# Patient Record
Sex: Male | Born: 1965
Health system: Southern US, Community
[De-identification: ages and names within clinical notes are randomized; demographics above are authoritative.]

## PROBLEM LIST (undated history)

## (undated) DIAGNOSIS — K219 Gastro-esophageal reflux disease without esophagitis: Secondary | ICD-10-CM

## (undated) DIAGNOSIS — R310 Gross hematuria: Secondary | ICD-10-CM

## (undated) DIAGNOSIS — I10 Essential (primary) hypertension: Secondary | ICD-10-CM

## (undated) DIAGNOSIS — E785 Hyperlipidemia, unspecified: Secondary | ICD-10-CM

## (undated) DIAGNOSIS — Z87442 Personal history of urinary calculi: Secondary | ICD-10-CM

## (undated) DIAGNOSIS — E119 Type 2 diabetes mellitus without complications: Secondary | ICD-10-CM

## (undated) DIAGNOSIS — R109 Unspecified abdominal pain: Secondary | ICD-10-CM

## (undated) DIAGNOSIS — N200 Calculus of kidney: Secondary | ICD-10-CM

## (undated) DIAGNOSIS — R10A1 Flank pain, right side: Secondary | ICD-10-CM

## (undated) HISTORY — DX: Hyperlipidemia, unspecified: E78.5

## (undated) HISTORY — DX: Essential (primary) hypertension: I10

## (undated) HISTORY — PX: OTHER SURGICAL HISTORY: SHX169

## (undated) HISTORY — DX: Gastro-esophageal reflux disease without esophagitis: K21.9

## (undated) HISTORY — DX: Type 2 diabetes mellitus without complications: E11.9

---

## 2006-09-27 ENCOUNTER — Emergency Department: Payer: Self-pay | Admitting: Emergency Medicine

## 2006-09-30 ENCOUNTER — Ambulatory Visit: Payer: Self-pay | Admitting: Orthopaedic Surgery

## 2007-01-03 ENCOUNTER — Ambulatory Visit: Payer: Self-pay | Admitting: Family Medicine

## 2008-03-02 HISTORY — PX: PERCUTANEOUS NEPHROLITHOTRIPSY: SHX2206

## 2010-04-18 ENCOUNTER — Emergency Department: Payer: Self-pay | Admitting: Emergency Medicine

## 2010-04-28 ENCOUNTER — Ambulatory Visit: Payer: Self-pay | Admitting: Urology

## 2010-05-02 ENCOUNTER — Ambulatory Visit: Payer: Self-pay | Admitting: Urology

## 2010-05-05 ENCOUNTER — Ambulatory Visit: Payer: Self-pay | Admitting: Urology

## 2010-12-03 ENCOUNTER — Ambulatory Visit: Payer: Self-pay | Admitting: Otolaryngology

## 2011-12-22 ENCOUNTER — Ambulatory Visit: Payer: Self-pay | Admitting: Family Medicine

## 2013-08-08 ENCOUNTER — Emergency Department: Payer: Self-pay | Admitting: Emergency Medicine

## 2013-08-08 LAB — CBC
HCT: 45 % (ref 40.0–52.0)
HGB: 14.9 g/dL (ref 13.0–18.0)
MCH: 29.2 pg (ref 26.0–34.0)
MCHC: 33.2 g/dL (ref 32.0–36.0)
MCV: 88 fL (ref 80–100)
PLATELETS: 199 10*3/uL (ref 150–440)
RBC: 5.11 10*6/uL (ref 4.40–5.90)
RDW: 12.8 % (ref 11.5–14.5)
WBC: 8.3 10*3/uL (ref 3.8–10.6)

## 2013-08-08 LAB — URINALYSIS, COMPLETE
BACTERIA: NONE SEEN
BILIRUBIN, UR: NEGATIVE
Glucose,UR: 50 mg/dL (ref 0–75)
KETONE: NEGATIVE
Leukocyte Esterase: NEGATIVE
Nitrite: NEGATIVE
PH: 5 (ref 4.5–8.0)
PROTEIN: NEGATIVE
SPECIFIC GRAVITY: 1.027 (ref 1.003–1.030)
SQUAMOUS EPITHELIAL: NONE SEEN
WBC UR: NONE SEEN /HPF (ref 0–5)

## 2013-08-08 LAB — COMPREHENSIVE METABOLIC PANEL
ALBUMIN: 3.6 g/dL (ref 3.4–5.0)
ALK PHOS: 93 U/L
ANION GAP: 7 (ref 7–16)
BILIRUBIN TOTAL: 0.9 mg/dL (ref 0.2–1.0)
BUN: 16 mg/dL (ref 7–18)
CALCIUM: 8.6 mg/dL (ref 8.5–10.1)
CO2: 22 mmol/L (ref 21–32)
CREATININE: 1.1 mg/dL (ref 0.60–1.30)
Chloride: 110 mmol/L — ABNORMAL HIGH (ref 98–107)
EGFR (Non-African Amer.): >60
GLUCOSE: 111 mg/dL — AB (ref 65–99)
Osmolality: 279 (ref 275–301)
Potassium: 3.5 mmol/L (ref 3.5–5.1)
SGOT(AST): 32 U/L (ref 15–37)
SGPT (ALT): 48 U/L (ref 12–78)
Sodium: 139 mmol/L (ref 136–145)
Total Protein: 6.9 g/dL (ref 6.4–8.2)

## 2013-08-08 LAB — LIPASE, BLOOD: Lipase: 173 U/L (ref 73–393)

## 2013-08-21 ENCOUNTER — Other Ambulatory Visit: Payer: Self-pay | Admitting: Urology

## 2013-09-07 ENCOUNTER — Encounter (HOSPITAL_BASED_OUTPATIENT_CLINIC_OR_DEPARTMENT_OTHER): Payer: Self-pay | Admitting: *Deleted

## 2013-09-11 NOTE — Progress Notes (Signed)
SPOKE W/ PT , HE STATES HAVING SEVERE PAIN AND DOES NOT HAVE ANY PAIN RX.  ADVISED TO CALL OFFICE. IF NO RELIEF GO TO ED.  WILL CALL PT BACK TOMORROW.

## 2013-09-12 ENCOUNTER — Encounter (HOSPITAL_BASED_OUTPATIENT_CLINIC_OR_DEPARTMENT_OTHER): Payer: Self-pay | Admitting: *Deleted

## 2013-09-12 NOTE — Progress Notes (Signed)
NPO AFTER MN. ARRIVE AT 1030. NEEDS HG. MAY TAKE TYLENOL AM DOS W/ SIPS OF WATER. ADVISED PT TO CALL OFFICE TODAY TO GET PAIN RX. HE MAY TAKE THIS AM DOS W/ SIPS  OF WATER.

## 2013-09-13 ENCOUNTER — Ambulatory Visit (HOSPITAL_BASED_OUTPATIENT_CLINIC_OR_DEPARTMENT_OTHER)
Admission: RE | Admit: 2013-09-13 | Discharge: 2013-09-13 | Disposition: A | Discharge status: Home / Self Care | Payer: BC Managed Care – PPO | Source: Ambulatory Visit | Attending: Urology | Admitting: Urology

## 2013-09-13 ENCOUNTER — Encounter (HOSPITAL_BASED_OUTPATIENT_CLINIC_OR_DEPARTMENT_OTHER): Payer: Self-pay | Admitting: Anesthesiology

## 2013-09-13 ENCOUNTER — Ambulatory Visit (HOSPITAL_BASED_OUTPATIENT_CLINIC_OR_DEPARTMENT_OTHER): Payer: BC Managed Care – PPO | Admitting: Anesthesiology

## 2013-09-13 ENCOUNTER — Encounter (HOSPITAL_BASED_OUTPATIENT_CLINIC_OR_DEPARTMENT_OTHER)
Admission: RE | Disposition: A | Discharge status: Home / Self Care | Payer: Self-pay | Source: Ambulatory Visit | Attending: Urology

## 2013-09-13 ENCOUNTER — Encounter (HOSPITAL_BASED_OUTPATIENT_CLINIC_OR_DEPARTMENT_OTHER): Payer: BC Managed Care – PPO | Admitting: Anesthesiology

## 2013-09-13 DIAGNOSIS — Z6836 Body mass index (BMI) 36.0-36.9, adult: Secondary | ICD-10-CM | POA: Insufficient documentation

## 2013-09-13 DIAGNOSIS — R310 Gross hematuria: Secondary | ICD-10-CM | POA: Insufficient documentation

## 2013-09-13 DIAGNOSIS — E669 Obesity, unspecified: Secondary | ICD-10-CM | POA: Insufficient documentation

## 2013-09-13 DIAGNOSIS — N200 Calculus of kidney: Secondary | ICD-10-CM | POA: Insufficient documentation

## 2013-09-13 HISTORY — DX: Personal history of urinary calculi: Z87.442

## 2013-09-13 HISTORY — PX: HOLMIUM LASER APPLICATION: SHX5852

## 2013-09-13 HISTORY — DX: Calculus of kidney: N20.0

## 2013-09-13 HISTORY — DX: Gross hematuria: R31.0

## 2013-09-13 HISTORY — DX: Unspecified abdominal pain: R10.9

## 2013-09-13 HISTORY — PX: CYSTOSCOPY WITH RETROGRADE PYELOGRAM, URETEROSCOPY AND STENT PLACEMENT: SHX5789

## 2013-09-13 HISTORY — DX: Flank pain, right side: R10.A1

## 2013-09-13 LAB — POCT I-STAT, CHEM 8
BUN: 13 mg/dL (ref 6–23)
CALCIUM ION: 1.27 mmol/L — AB (ref 1.12–1.23)
CHLORIDE: 107 meq/L (ref 96–112)
Creatinine, Ser: 0.9 mg/dL (ref 0.50–1.35)
Glucose, Bld: 103 mg/dL — ABNORMAL HIGH (ref 70–99)
HEMATOCRIT: 50 % (ref 39.0–52.0)
Hemoglobin: 17 g/dL (ref 13.0–17.0)
POTASSIUM: 3.8 meq/L (ref 3.7–5.3)
Sodium: 145 mEq/L (ref 137–147)
TCO2: 22 mmol/L (ref 0–100)

## 2013-09-13 SURGERY — CYSTOURETEROSCOPY, WITH RETROGRADE PYELOGRAM AND STENT INSERTION
Anesthesia: General | Site: Ureter | Laterality: Right

## 2013-09-13 MED ORDER — IOHEXOL 350 MG/ML SOLN
INTRAVENOUS | Status: DC | PRN
  Administered 2013-09-13: 24 mL via URETHRAL

## 2013-09-13 MED ORDER — ACETAMINOPHEN 10 MG/ML IV SOLN
INTRAVENOUS | Status: DC | PRN
  Administered 2013-09-13: 1000 mg via INTRAVENOUS

## 2013-09-13 MED ORDER — KETOROLAC TROMETHAMINE 30 MG/ML IJ SOLN
INTRAMUSCULAR | Status: DC | PRN
  Administered 2013-09-13: 30 mg via INTRAVENOUS

## 2013-09-13 MED ORDER — SENNOSIDES-DOCUSATE SODIUM 8.6-50 MG PO TABS: 1.0000 | ORAL_TABLET | Freq: Two times a day (BID) | ORAL | Status: DC

## 2013-09-13 MED ORDER — STERILE WATER FOR IRRIGATION IR SOLN
Status: DC | PRN
  Administered 2013-09-13: 500 mL

## 2013-09-13 MED ORDER — OXYBUTYNIN CHLORIDE 5 MG PO TABS: 5.0000 mg | ORAL_TABLET | Freq: Three times a day (TID) | ORAL | Status: DC | PRN

## 2013-09-13 MED ORDER — LIDOCAINE HCL (CARDIAC) 20 MG/ML IV SOLN
INTRAVENOUS | Status: DC | PRN
  Administered 2013-09-13: 100 mg via INTRAVENOUS

## 2013-09-13 MED ORDER — EPHEDRINE SULFATE 50 MG/ML IJ SOLN
INTRAMUSCULAR | Status: DC | PRN
  Administered 2013-09-13: 10 mg via INTRAVENOUS

## 2013-09-13 MED ORDER — LACTATED RINGERS IV SOLN
INTRAVENOUS | Status: DC
  Administered 2013-09-13: 11:00:00 via INTRAVENOUS
  Filled 2013-09-13: qty 1000

## 2013-09-13 MED ORDER — FENTANYL CITRATE 0.05 MG/ML IJ SOLN
INTRAMUSCULAR | Status: AC
  Filled 2013-09-13: qty 4

## 2013-09-13 MED ORDER — OXYCODONE-ACETAMINOPHEN 5-325 MG PO TABS: 1.0000 | ORAL_TABLET | Freq: Four times a day (QID) | ORAL | Status: DC | PRN

## 2013-09-13 MED ORDER — PROPOFOL INFUSION 10 MG/ML OPTIME
INTRAVENOUS | Status: DC | PRN
  Administered 2013-09-13: 200 mL via INTRAVENOUS

## 2013-09-13 MED ORDER — MIDAZOLAM HCL 5 MG/5ML IJ SOLN
INTRAMUSCULAR | Status: DC | PRN
  Administered 2013-09-13: 2 mg via INTRAVENOUS

## 2013-09-13 MED ORDER — MIDAZOLAM HCL 2 MG/2ML IJ SOLN
INTRAMUSCULAR | Status: AC
  Filled 2013-09-13: qty 2

## 2013-09-13 MED ORDER — PROPOFOL 10 MG/ML IV BOLUS
INTRAVENOUS | Status: DC | PRN
  Administered 2013-09-13: 200 mg via INTRAVENOUS

## 2013-09-13 MED ORDER — SULFAMETHOXAZOLE-TMP DS 800-160 MG PO TABS: 1.0000 | ORAL_TABLET | Freq: Two times a day (BID) | ORAL | Status: DC

## 2013-09-13 MED ORDER — FENTANYL CITRATE 0.05 MG/ML IJ SOLN
INTRAMUSCULAR | Status: DC | PRN
  Administered 2013-09-13 (×3): 50 ug via INTRAVENOUS

## 2013-09-13 MED ORDER — ONDANSETRON HCL 4 MG/2ML IJ SOLN
INTRAMUSCULAR | Status: DC | PRN
  Administered 2013-09-13: 4 mg via INTRAVENOUS

## 2013-09-13 MED ORDER — GENTAMICIN SULFATE 40 MG/ML IJ SOLN
5.0000 mg/kg | Freq: Once | INTRAMUSCULAR | Status: AC
  Administered 2013-09-13: 470 mg via INTRAVENOUS
  Filled 2013-09-13: qty 11.75

## 2013-09-13 MED ORDER — SODIUM CHLORIDE 0.9 % IR SOLN
Status: DC | PRN
  Administered 2013-09-13: 6000 mL via INTRAVESICAL

## 2013-09-13 MED ORDER — DEXAMETHASONE SODIUM PHOSPHATE 10 MG/ML IJ SOLN
INTRAMUSCULAR | Status: DC | PRN
  Administered 2013-09-13: 10 mg via INTRAVENOUS

## 2013-09-13 MED ORDER — GENTAMICIN IN SALINE 1.6-0.9 MG/ML-% IV SOLN
80.0000 mg | INTRAVENOUS | Status: DC
  Filled 2013-09-13: qty 50

## 2013-09-13 SURGICAL SUPPLY — 20 items
BAG DRAIN URO-CYSTO SKYTR STRL (DRAIN) ×3 IMPLANT
BASKET LASER NITINOL 1.9FR (BASKET) ×3 IMPLANT
CANISTER SUCT LVC 12 LTR MEDI- (MISCELLANEOUS) ×3 IMPLANT
CATH INTERMIT  6FR 70CM (CATHETERS) ×3 IMPLANT
CLOTH BEACON ORANGE TIMEOUT ST (SAFETY) ×3 IMPLANT
DRAPE CAMERA CLOSED 9X96 (DRAPES) ×3 IMPLANT
DRSG TEGADERM 2-3/8X2-3/4 SM (GAUZE/BANDAGES/DRESSINGS) ×3 IMPLANT
GLOVE BIO SURGEON STRL SZ 6.5 (GLOVE) ×3 IMPLANT
GLOVE BIO SURGEON STRL SZ7.5 (GLOVE) ×3 IMPLANT
GLOVE INDICATOR 6.5 STRL GRN (GLOVE) ×6 IMPLANT
GOWN STRL REUS W/ TWL XL LVL3 (GOWN DISPOSABLE) ×4 IMPLANT
GOWN STRL REUS W/TWL XL LVL3 (GOWN DISPOSABLE) ×2 IMPLANT
GUIDEWIRE STR DUAL SENSOR (WIRE) ×3 IMPLANT
IV NS IRRIG 3000ML ARTHROMATIC (IV SOLUTION) ×6 IMPLANT
LASER FIBER DISP (UROLOGICAL SUPPLIES) ×3 IMPLANT
PACK CYSTOSCOPY (CUSTOM PROCEDURE TRAY) ×3 IMPLANT
SHEATH ACCESS URETERAL 38CM (SHEATH) ×3 IMPLANT
STENT POLARIS 5FRX26 (STENTS) ×3 IMPLANT
SYRINGE 10CC LL (SYRINGE) ×3 IMPLANT
WATER STERILE IRR 500ML POUR (IV SOLUTION) ×3 IMPLANT

## 2013-09-13 NOTE — Anesthesia Procedure Notes (Signed)
Procedure Name: LMA Insertion Date/Time: 09/13/2013 12:02 PM Performed by: Norva PavlovALLAWAY, Jerome Hands G Pre-anesthesia Checklist: Patient identified, Timeout performed, Emergency Drugs available, Suction available and Patient being monitored Patient Re-evaluated:Patient Re-evaluated prior to inductionPreoxygenation: Pre-oxygenation with 100% oxygen Intubation Type: IV induction Ventilation: Mask ventilation without difficulty LMA: LMA inserted LMA Size: 5.0 Number of attempts: 1 Airway Equipment and Method: Bite block Placement Confirmation: positive ETCO2 Tube secured with: Tape Dental Injury: Teeth and Oropharynx as per pre-operative assessment

## 2013-09-13 NOTE — Anesthesia Preprocedure Evaluation (Addendum)
Anesthesia Evaluation  Patient identified by MRN, date of birth, ID band Patient awake    Reviewed: Allergy & Precautions, H&P , NPO status , Patient's Chart, lab work & pertinent test results  Airway Mallampati: III TM Distance: >3 FB Neck ROM: full    Dental no notable dental hx. (+) Teeth Intact, Dental Advisory Given   Pulmonary neg pulmonary ROS,  Negative sleep study in past breath sounds clear to auscultation  Pulmonary exam normal       Cardiovascular Exercise Tolerance: Good negative cardio ROS  Rhythm:regular Rate:Normal     Neuro/Psych negative neurological ROS  negative psych ROS   GI/Hepatic negative GI ROS, Neg liver ROS,   Endo/Other  negative endocrine ROS  Renal/GU Renal diseaseRenal calculus  negative genitourinary   Musculoskeletal   Abdominal (+) + obese,   Peds  Hematology negative hematology ROS (+)   Anesthesia Other Findings Receeding chin  Reproductive/Obstetrics negative OB ROS                        Anesthesia Physical Anesthesia Plan  ASA: II  Anesthesia Plan: General   Post-op Pain Management:    Induction: Intravenous  Airway Management Planned: LMA  Additional Equipment:   Intra-op Plan:   Post-operative Plan:   Informed Consent: I have reviewed the patients History and Physical, chart, labs and discussed the procedure including the risks, benefits and alternatives for the proposed anesthesia with the patient or authorized representative who has indicated his/her understanding and acceptance.   Dental Advisory Given  Plan Discussed with: CRNA and Surgeon  Anesthesia Plan Comments:        Anesthesia Quick Evaluation

## 2013-09-13 NOTE — Anesthesia Postprocedure Evaluation (Signed)
Anesthesia Post Note  Patient: Jerome Graves  Procedure(s) Performed: Procedure(s) (LRB): CYSTOSCOPY WITH BILATERAL RETROGRADE PYELOGRAM, RIGHT URETEROSCOPY AND STENT PLACEMENT (Bilateral) HOLMIUM LASER APPLICATION (Right)  Anesthesia type: General  Patient location: PACU  Post pain: Pain level controlled  Post assessment: Post-op Vital signs reviewed  Last Vitals: BP 135/90  Pulse 101  Temp(Src) 37 C (Oral)  Resp 14  Ht 6' (1.829 m)  Wt 266 lb 8 oz (120.884 kg)  BMI 36.14 kg/m2  SpO2 95%  Post vital signs: Reviewed  Level of consciousness: sedated  Complications: No apparent anesthesia complications

## 2013-09-13 NOTE — H&P (Signed)
Jerome Graves is an 48 y.o. male.    Chief Complaint: Pre-Op Cysto, Bilateral Retrogrades, Right Ureteroscopic Stone Manipulation.  HPI:   1 - Nephrolithiasis -  Pre 2015 : MET x 1, URS on left x 1 07/2013 - right 87m renal pelvis [facility]. No additional or contralateral stones per report, images unavailable for review.  2 - Metabolic Stone Disease -  Eval 2015: BMP,PTH,Ureate - elevated uric acid; Composition - pending; 24 Hr Urines - pending  3 - Gross Hematuria - Pt wih new gross hematuria and clots 07/2013[facility]. NO cysto yet. Non-smoker. He is a dEngineer, building servicesworking with solvents daily.   PMH sig for HTN  Today RJerryis seen to proceed with cysto and right ureteroscopy to complete his hematuria evaluation and address his right renal stone. No interval fevers. Most recent UCX negative.  Past Medical History  Diagnosis Date  . Renal calculus, right   . Gross hematuria   . History of kidney stones   . Right flank pain     Past Surgical History  Procedure Laterality Date  . Negative sleep study      2003 -- PER PT  . Percutaneous nephrolithotripsy Left 2010  . Pinning right ring finger fx  YRS AGO    History reviewed. No pertinent family history. Social History:  reports that he has never smoked. He has never used smokeless tobacco. He reports that he drinks about one ounce of alcohol per week. He reports that he does not use illicit drugs.  Allergies: No Known Allergies  No prescriptions prior to admission    No results found for this or any previous visit (from the past 48 hour(s)). No results found.  Review of Systems  Constitutional: Negative.  Negative for fever and chills.  HENT: Negative.   Eyes: Negative.   Cardiovascular: Negative.   Genitourinary: Positive for flank pain.  Musculoskeletal: Negative.   Skin: Negative.   Neurological:  Negative.   Endo/Heme/Allergies: Negative.   Psychiatric/Behavioral: Negative.     Height 6' (1.829 m), weight 122.471 kg (270 lb). Physical Exam  Constitutional: He is oriented to person, place, and time. He appears well-developed and well-nourished.  HENT:  Head: Normocephalic and atraumatic.  Eyes: EOM are normal. Pupils are equal, round, and reactive to light.  Neck: Normal range of motion. Neck supple.  Cardiovascular: Normal rate.   Respiratory: Effort normal.  GI: Soft. Bowel sounds are normal.  Genitourinary: Penis normal.  Mild Rt CVAT  Musculoskeletal: Normal range of motion.  Neurological: He is alert and oriented to person, place, and time.  Skin: Skin is warm and dry.  Psychiatric: He has a normal mood and affect. His behavior is normal. Judgment and thought content normal.     Assessment/Plan  1 - Nephrolithiasis - present stone burden w/o hydro on recent CT, but location raises possibility of intermitant obstruiton (ball-valve)  After careful consideration, the patient has elected to undergo URS for goal of stone free and compelte hematuria eval wtih cysto and bilat retrogrades.   We rediscussed ureteroscopic stone manipulation with basketing and laser-lithotripsy in detail.  We rediscussed risks including bleeding, infection, damage to kidney / ureter  bladder, rarely loss of kidney. We rediscussed anesthetic risks and rare but serious surgical complications including DVT, PE, MI, and mortality. We specifically readdressed that in 5-10% a staged approach is required.  Pt voiced understanding and desires to proceed today as planned.  2 - Metabolic Stone Disease - Composition and 24 hr urines to follow.   3 - Gross Hematuria - likely stone disease, but will complete eval with cysto at time of URS today.  Jerome Graves 09/13/2013, 6:16 AM

## 2013-09-13 NOTE — Brief Op Note (Signed)
09/13/2013  12:50 PM  PATIENT:  Jerome Graves  48 y.o. male  PRE-OPERATIVE DIAGNOSIS:  GROSS HEMATURIA, RIGHT RENAL STONE  POST-OPERATIVE DIAGNOSIS:  GROSS HEMATURIA, RIGHT RENAL STONE  PROCEDURE:  Procedure(s): CYSTOSCOPY WITH BILATERAL RETROGRADE PYELOGRAM, RIGHT URETEROSCOPY AND STENT PLACEMENT (Bilateral) HOLMIUM LASER APPLICATION (Right)  SURGEON:  Surgeon(s) and Role:    * Sebastian Acheheodore Keirstin Musil, MD - Primary  PHYSICIAN ASSISTANT:   ASSISTANTS: none   ANESTHESIA:   general  EBL:     BLOOD ADMINISTERED:none  DRAINS: none   LOCAL MEDICATIONS USED:  NONE  SPECIMEN:  Source of Specimen:  Rt renal stone  DISPOSITION OF SPECIMEN:  [facility] for compositional analysis  COUNTS:  YES  TOURNIQUET:  * No tourniquets in log *  DICTATION: .Other Dictation: Dictation Number [id number]  PLAN OF CARE: Discharge to home after PACU  PATIENT DISPOSITION:  PACU - hemodynamically stable.   Delay start of Pharmacological VTE agent (>24hrs) due to surgical blood loss or risk of bleeding: not applicable

## 2013-09-13 NOTE — Discharge Instructions (Signed)
1 - You may have urinary urgency (bladder spasms) and bloody urine on / off with stent in place. This is normal. ° °2 - Call MD or go to ER for fever >102, severe pain / nausea / vomiting not relieved by medications, or acute change in medical status ° °3 - Remove tethered stent on Monday morning at home by pulling on string, then blue/white plastic tubing, and discarding. Dr. Manny is in the office Monday if any issues arise.  °Post Anesthesia Home Care Instructions ° °Activity: °Get plenty of rest for the remainder of the day. A responsible adult should stay with you for 24 hours following the procedure.  °For the next 24 hours, DO NOT: °-Drive a car °-Operate machinery °-Drink alcoholic beverages °-Take any medication unless instructed by your physician °-Make any legal decisions or sign important papers. ° °Meals: °Start with liquid foods such as gelatin or soup. Progress to regular foods as tolerated. Avoid greasy, spicy, heavy foods. If nausea and/or vomiting occur, drink only clear liquids until the nausea and/or vomiting subsides. Call your physician if vomiting continues. ° °Special Instructions/Symptoms: °Your throat may feel dry or sore from the anesthesia or the breathing tube placed in your throat during surgery. If this causes discomfort, gargle with warm salt water. The discomfort should disappear within 24 hours. °[facility] °[phone] °Post Ureteroscopy With or Without Stent Instructions ° °Definitions: ° °Ureter: The duct that transports urine from the kidney to the bladder. °Stent:   A plastic hollow tube that is placed into the ureter, from the kidney to the                 bladder to prevent the ureter from swelling shut. ° °GENERAL INSTRUCTIONS: ° °Despite the fact that no skin incisions were used, the area around the ureter and bladder is raw and irritated. The stent is a foreign body which will further irritate the bladder wall. This irritation is manifested by increased  frequency of urination, both day and night, and by an increase in the urge to urinate. In some, the urge to urinate is present almost always. Sometimes the urge is strong enough that you may not be able to stop yourself from urinating. The only real cure is to remove the stent and then give time for the bladder wall to heal which can't be done until the danger of the ureter swelling shut has passed, which varies. ° °You may see some blood in your urine while the stent is in place and a few days afterwards. Do not be alarmed, even if the urine was clear for a while. Get off your feet and drink lots of fluids until clearing occurs. If you start to pass clots or don't improve, call us. ° °DIET: °You may return to your normal diet immediately. Because of the raw surface of your bladder, alcohol, spicy foods, acid type foods and drinks with caffeine may cause irritation or frequency and should be used in moderation. To keep your urine flowing freely and to avoid constipation, drink plenty of fluids during the day ( 8-10 glasses ). °Tip: Avoid cranberry juice because it is very acidic. ° °ACTIVITY: °Your physical activity doesn't need to be restricted. However, if you are very active, you may see some blood in your urine. We suggest that you reduce your activity under these circumstances until the bleeding has stopped. ° °BOWELS: °It is important to keep your bowels regular during the postoperative period. Straining with bowel movements can   cause bleeding. A bowel movement every other day is reasonable. Use a mild laxative if needed, such as Milk of Magnesia 2-3 tablespoons, or 2 Dulcolax tablets. Call if you continue to have problems. If you have been taking narcotics for pain, before, during or after your surgery, you may be constipated. Take a laxative if necessary. ° ° °MEDICATION: °You should resume your pre-surgery medications unless told not to. In addition you will often be given an antibiotic to prevent  infection. These should be taken as prescribed until the bottles are finished unless you are having an unusual reaction to one of the drugs. ° °PROBLEMS YOU SHOULD REPORT TO US: °· Fevers over 100.5 Fahrenheit. °· Heavy bleeding, or clots ( See above notes about blood in urine ). °· Inability to urinate. °· Drug reactions ( hives, rash, nausea, vomiting, diarrhea ). °· Severe burning or pain with urination that is not improving. ° °FOLLOW-UP: °You will need a follow-up appointment to monitor your progress. Call for this appointment at the number listed above. Usually the first appointment will be about three to fourteen days after your surgery. ° ° ° ° ° °

## 2013-09-13 NOTE — OR Nursing (Signed)
Right ureteral stone taking by Dr. Manny. 

## 2013-09-13 NOTE — Transfer of Care (Signed)
Immediate Anesthesia Transfer of Care Note  Patient: Assunta FoundRodney W Furber  Procedure(s) Performed: Procedure(s): CYSTOSCOPY WITH BILATERAL RETROGRADE PYELOGRAM, RIGHT URETEROSCOPY AND STENT PLACEMENT (Bilateral) HOLMIUM LASER APPLICATION (Right)  Patient Location: PACU  Anesthesia Type:General  Level of Consciousness: awake, alert , oriented and patient cooperative  Airway & Oxygen Therapy: Patient Spontanous Breathing and Patient connected to nasal cannula oxygen  Post-op Assessment: Report given to PACU RN and Post -op Vital signs reviewed and stable  Post vital signs: Reviewed and stable  Complications: No apparent anesthesia complications

## 2013-09-14 ENCOUNTER — Encounter (HOSPITAL_BASED_OUTPATIENT_CLINIC_OR_DEPARTMENT_OTHER): Payer: Self-pay | Admitting: Urology

## 2013-09-15 NOTE — Op Note (Signed)
NAMMarland Kitchen:  Jerome Graves, Jerome Graves               ACCOUNT NO.:  [id number]  MEDICAL RECORD NO.:  [id number]  LOCATION:                                 FACILITY:  PHYSICIAN:  Sebastian Acheheodore Naquan Garman, MD     DATE OF BIRTH:  May 21, 1965  DATE OF PROCEDURE:  09/13/2013 DATE OF DISCHARGE:                              OPERATIVE REPORT   DIAGNOSES:  Right renal stone, refractory right flank pain, history of gross hematuria.  PROCEDURE: 1. Cystoscopy with bilateral retrograde pyelogram interpretation. 2. Right ureteroscopy with laser lithotripsy. 3. Insertion of right ureteral stent, 5 x 26 Polaris with tether to     the dorsum of the penis.  FINDINGS: 1. Unremarkable urinary bladder. 2. Unremarkable left retrograde pyelogram. 3. Right retrograde pyelogram with mild caliectasis and questionable     filling defect in the proximal ureter. 4. Single large fusiform stone free floating in the renal pelvis on     the right, this is completely removed. 5. Excellent placement of right ureteral stent, proximal curl renal     pelvis distal aspect urinary bladder, following stent placement.  ESTIMATED BLOOD LOSS:  Nil.  INDICATION:  [name] is a very pleasant 48 year old gentleman with history of recurrent nephrolithiasis.  He was found on workup with gross hematuria and then colicky flank pain to have a right renal stone.  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  Given these constellation of findings, options were discussed including office cystoscopy and observation of stone versus definitive management of stone with various techniques and he wished to proceed with operative cysto and right ureteroscopic stone manipulation as an efficient means to complete his hematuria evaluation to treat his right stone that is likely ball valving and symptomatic.  Informed consent was obtained and placed in medical record.  DESCRIPTION OF PROCEDURE:  The patient being Jerome Graves, procedure being right ureteroscopic stone manipulation, cystoscopy, and bilateral pyelogram was confirmed.  Procedure was carried out.  Time-out was performed.  Intravenous antibiotics administered.  General LMA anesthesia was introduced.  The patient placed into a low lithotomy position.  Sterile field was created by prepping and draping the patient's penis, perineum, and proximal thighs using iodine x3.  Next, cystourethroscopy was performed using a 22-French rigid cystoscope with 12-degree offset lens.  Inspection of the anterior and posterior urethra unremarkable.  Inspection of urinary bladder revealed no diverticula, calcifications, or papillary lesions.  The left ureteral orifice was cannulated with a 6-French end-hole catheter and left retrograde pyelogram was obtained.  Left retrograde pyelogram demonstrated a single left ureter with single system left kidney.  No filling defects or narrowing noted.  Similarly, right retrograde pyelogram was seen.  Right retrograde pyelogram demonstrated single right ureter with single system right kidney.  There was a questionable filling defect in the proximal ureter with mild hydroureteronephrosis above this consistent with likely distal migration of known ball valving right renal stone.  A 0.038 Glidewire was advanced at the level of the upper pole and set aside as a safety wire.  Next, semi-rigid ureteroscopy was performed in the distal two-thirds of the right ureter alongside a separate Sensor working wire  with an 8-French feeding tube in the urinary bladder for pressure release.  No mucosal abnormalities or calcifications were seen in the distal ureter.  Next, the semi-rigid ureteroscope was exchanged for a 12/14 38-cm ureteral access sheath, which was placed under continuous fluoroscopic guidance to the level of proximal ureter.  Next, flexible digital ureteroscopy was performed using a 6-French tip flexible digital  ureteroscope.  Inspection of the proximal ureter and systematic inspection of each calix of the kidney revealed a rather large fusiform floating stone in the renal pelvis likely consistent with suspected ball valving stone on the right side.  No additional calcifications were seen.  The stone appeared to be much too large for simple basketing.  As such, holmium laser energy was applied to the stone using settings of 0.5 joules and 5 Hz using a 200 nanometer fiber and the stone was fragmented into fragments approximately 2-3 mm or less in diameter.  These were then sequentially grasped with an escape basket and brought out in their entirety and set aside for compositional analysis.  Repeat systematic inspection of the kidney x2 revealed complete resolution of all stone fragments  Larger than one-third millimeter, no evidence of perforation.  There was excellent hemostasis.  The sheath was removed under continuous ureteroscopic vision and no mucosal abnormalities were found.  Finally, a new 5 x 26 Polaris type stent was placed with remaining safety wire using fluoroscopic guidance and tether fashioned to the dorsum of the penis.  Good proximal and distal deployment were noted.  Bladder was emptied per cystoscope.  Procedure was then terminated.  The patient tolerated the procedure well.  There were no immediate periprocedural complications. The patient was taken to the postanesthesia care unit in stable condition.          ______________________________ Sebastian Ache, MD     TM/MEDQ  D:  09/13/2013  T:  09/13/2013  Job:  [id number]

## 2014-08-06 ENCOUNTER — Other Ambulatory Visit: Payer: Self-pay | Admitting: Family Medicine

## 2014-08-06 DIAGNOSIS — I10 Essential (primary) hypertension: Secondary | ICD-10-CM

## 2014-08-06 MED ORDER — AMLODIPINE BESYLATE 5 MG PO TABS
5.0000 mg | ORAL_TABLET | Freq: Every day | ORAL | Status: DC
Start: 2014-08-06 — End: 2014-09-21

## 2014-09-20 ENCOUNTER — Ambulatory Visit: Payer: Self-pay | Admitting: Family Medicine

## 2014-09-21 ENCOUNTER — Ambulatory Visit (INDEPENDENT_AMBULATORY_CARE_PROVIDER_SITE_OTHER): Payer: BLUE CROSS/BLUE SHIELD | Admitting: Family Medicine

## 2014-09-21 ENCOUNTER — Encounter: Payer: Self-pay | Admitting: Family Medicine

## 2014-09-21 VITALS — BP 150/118 | HR 90 | Temp 99.0°F | Resp 16 | Ht 72.0 in | Wt 269.8 lb

## 2014-09-21 DIAGNOSIS — Z87442 Personal history of urinary calculi: Secondary | ICD-10-CM | POA: Insufficient documentation

## 2014-09-21 DIAGNOSIS — Z8619 Personal history of other infectious and parasitic diseases: Secondary | ICD-10-CM

## 2014-09-21 DIAGNOSIS — R739 Hyperglycemia, unspecified: Secondary | ICD-10-CM | POA: Insufficient documentation

## 2014-09-21 DIAGNOSIS — K219 Gastro-esophageal reflux disease without esophagitis: Secondary | ICD-10-CM | POA: Insufficient documentation

## 2014-09-21 DIAGNOSIS — I10 Essential (primary) hypertension: Secondary | ICD-10-CM | POA: Diagnosis not present

## 2014-09-21 DIAGNOSIS — Z658 Other specified problems related to psychosocial circumstances: Secondary | ICD-10-CM

## 2014-09-21 DIAGNOSIS — F439 Reaction to severe stress, unspecified: Secondary | ICD-10-CM

## 2014-09-21 DIAGNOSIS — K1120 Sialoadenitis, unspecified: Secondary | ICD-10-CM | POA: Insufficient documentation

## 2014-09-21 HISTORY — DX: Personal history of other infectious and parasitic diseases: Z86.19

## 2014-09-21 HISTORY — DX: Personal history of urinary calculi: Z87.442

## 2014-09-21 MED ORDER — SERTRALINE HCL 50 MG PO TABS: 50.0000 mg | ORAL_TABLET | Freq: Every day | ORAL | Status: DC

## 2014-09-21 MED ORDER — AMLODIPINE BESYLATE 5 MG PO TABS: 5.0000 mg | ORAL_TABLET | Freq: Every day | ORAL | Status: DC

## 2014-09-21 NOTE — Progress Notes (Signed)
Subjective:     Patient ID: Jerome Graves, male   DOB: [birth year], 49 y.o.   MRN: [id number]  HPI  Chief Complaint  Patient presents with  . Hypertension    patient is here for follow up, last office visit was 07/09/14. At last office visit patient was advised to continue Lisinopril and to start Amlodipine Besylate , blood pressure in office that day was 172/96  He was lost to f/u and is not on his medication at this time. Also reports increased stress at work with increased irritability. 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   Review of Systems     Objective:   Physical Exam  Constitutional: He appears well-developed and well-nourished. No distress.  Psychiatric: He has a normal mood and affect.       Assessment:    1. Situational stres - sertraline (ZOLOFT) 50 MG tablet; Take 1 tablet (50 mg total) by mouth daily. Start at 1/2 pill for the first 5 days.  Dispense: 30 tablet; Refill: 0  2. Essential hypertension- - amLODipine (NORVASC) 5 MG tablet; Take 1 tablet (5 mg total) by mouth daily.  Dispense: 90 tablet; Refill: 0    Plan:    Resume bp meds (has refills on lisinopril). Will reassess in three weeks.

## 2014-09-21 NOTE — Patient Instructions (Addendum)
Resume blood pressure medication. Try sertraline at 1/2 pill for the first few days then a whole pill.

## 2014-10-11 ENCOUNTER — Ambulatory Visit (INDEPENDENT_AMBULATORY_CARE_PROVIDER_SITE_OTHER): Payer: BLUE CROSS/BLUE SHIELD | Admitting: Family Medicine

## 2014-10-11 ENCOUNTER — Encounter: Payer: Self-pay | Admitting: Family Medicine

## 2014-10-11 VITALS — BP 140/84 | HR 106 | Temp 98.7°F | Resp 18 | Wt 265.4 lb

## 2014-10-11 DIAGNOSIS — I10 Essential (primary) hypertension: Secondary | ICD-10-CM

## 2014-10-11 DIAGNOSIS — Z658 Other specified problems related to psychosocial circumstances: Secondary | ICD-10-CM

## 2014-10-11 DIAGNOSIS — F439 Reaction to severe stress, unspecified: Secondary | ICD-10-CM

## 2014-10-11 MED ORDER — AMLODIPINE BESYLATE 10 MG PO TABS: 10.0000 mg | ORAL_TABLET | Freq: Every day | ORAL | Status: DC

## 2014-10-11 MED ORDER — SERTRALINE HCL 50 MG PO TABS: 50.0000 mg | ORAL_TABLET | Freq: Every day | ORAL | Status: DC

## 2014-10-11 NOTE — Patient Instructions (Signed)
If blood pressure is ok at your DOT physical continue on current dose and f/u in 6 months. Let me know if you need a higher dose of sertraline.

## 2014-10-11 NOTE — Progress Notes (Signed)
Subjective:     Patient ID: Jerome Graves, male   DOB: 1965-07-11, 49 y.o.   MRN: [id number]  HPI  Chief Complaint  Patient presents with  . Follow-up    hypertension  . Follow-up    situational stress  States he is feeling better. Has coped better with stress at work and he reports his wife has noted changes as well. His DOT physical is coming up in 2 weeks. Wishes to optimize his blood pressure.   Review of Systems  Respiratory: Negative for shortness of breath.   Cardiovascular: Negative for chest pain and palpitations.       Objective:   Physical Exam  Constitutional: He appears well-developed and well-nourished. No distress.  Cardiovascular: Normal rate and regular rhythm.   Pulmonary/Chest: Breath sounds normal.  Musculoskeletal: He exhibits no edema (of lower extremities).       Assessment:    1. Situational stress - sertraline (ZOLOFT) 50 MG tablet; Take 1 tablet (50 mg total) by mouth daily.  Dispense: 30 tablet; Refill: 5  2. Essential hypertension - amLODipine (NORVASC) 10 MG tablet; Take 1 tablet (10 mg total) by mouth daily.  Dispense: 30 tablet; Refill    Plan:   F/u in 6 months or sooner as needed.

## 2014-11-16 ENCOUNTER — Telehealth: Payer: Self-pay | Admitting: Family Medicine

## 2014-11-16 NOTE — Telephone Encounter (Signed)
Pt contacted office for refill request on the following medications:   °

## 2015-04-18 ENCOUNTER — Ambulatory Visit: Payer: BLUE CROSS/BLUE SHIELD | Admitting: Family Medicine

## 2015-07-15 ENCOUNTER — Encounter: Payer: Self-pay | Admitting: Family Medicine

## 2015-07-15 ENCOUNTER — Ambulatory Visit (INDEPENDENT_AMBULATORY_CARE_PROVIDER_SITE_OTHER): Payer: BLUE CROSS/BLUE SHIELD | Admitting: Family Medicine

## 2015-07-15 VITALS — BP 162/100 | HR 109 | Temp 98.3°F | Resp 19 | Wt 285.4 lb

## 2015-07-15 DIAGNOSIS — W57XXXA Bitten or stung by nonvenomous insect and other nonvenomous arthropods, initial encounter: Secondary | ICD-10-CM | POA: Diagnosis not present

## 2015-07-15 DIAGNOSIS — I152 Hypertension secondary to endocrine disorders: Secondary | ICD-10-CM | POA: Insufficient documentation

## 2015-07-15 DIAGNOSIS — T148 Other injury of unspecified body region: Secondary | ICD-10-CM

## 2015-07-15 DIAGNOSIS — I10 Essential (primary) hypertension: Secondary | ICD-10-CM

## 2015-07-15 MED ORDER — PREDNISONE 10 MG PO TABS: ORAL_TABLET | ORAL | Status: DC

## 2015-07-15 NOTE — Progress Notes (Signed)
Subjective:     Patient ID: Jerome Graves, male   DOB: 1965/07/14, 50 y.o.   MRN: [id number]  HPI  Chief Complaint  Patient presents with  . Rash    Patient comes in office today with concerns of rash that has spreaded over his entire body. Patient states that on Tuesday 07/09/15, he was out in woods and had pulled a tick off on the right side of his back. Patient states that 48 hrs after pulling off tick he noticed a rash on the left side of his abdomen which has now spread through out his body. Patient states that he has been using otc Calomine lotion with no relief.   . Hypertension    Follow up form 10/11/14, patient was advised to continue Amlodipine 10mg , paitents blood pressure in house was 140/84. Patient reports poor compliance on medication.   States rash started in his left lateral chest area but now has it on his arms and lower abdomen and beginning on his left upper leg. States it is intensely itchy. Will get transient relief with a hot shower. No fever or flu like symptoms. States area around rash bite has improved. 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   Review of Systems     Objective:   Physical Exam  Constitutional: He appears well-developed and well-nourished. No distress.  Skin:  Numerous excoriated papules on his arms. Raised plaques on his abdomen. Back is spared. Left upper back with papule with minimal inflammatory flare c/w prior tick bite.       Assessment:    1. Insect bites - predniSONE (DELTASONE) 10 MG tablet; Taper daily as follows: 6 pills, 5, 4, 3, 2, 1  Dispense: 21 tablet; Refill: 0  2. Essential hypertension: poor control     Plan:    Discussed use of oral Benadryl at night and plain Calamine topically. Resume bp medications for 2-4 weeks and f/u in the office.

## 2015-07-15 NOTE — Patient Instructions (Addendum)
Try Benadryl at night to help you sleep. Hot shower at night will help reduce itching. May use plain calamine for your skin. Resume taking your bp medications regularly and f/u with me in 2-4 weeks.

## 2015-10-28 ENCOUNTER — Ambulatory Visit (INDEPENDENT_AMBULATORY_CARE_PROVIDER_SITE_OTHER): Payer: BLUE CROSS/BLUE SHIELD | Admitting: Family Medicine

## 2015-10-28 ENCOUNTER — Encounter: Payer: Self-pay | Admitting: Family Medicine

## 2015-10-28 VITALS — BP 154/108 | HR 92 | Temp 98.1°F | Resp 16 | Wt 277.2 lb

## 2015-10-28 DIAGNOSIS — I10 Essential (primary) hypertension: Secondary | ICD-10-CM

## 2015-10-28 MED ORDER — CHLORTHALIDONE 25 MG PO TABS: 25.0000 mg | ORAL_TABLET | Freq: Every day | ORAL | 1 refills | Status: DC

## 2015-10-28 NOTE — Progress Notes (Signed)
Subjective:     Patient ID: Jerome Graves, male   DOB: 08/09/1965, 50 y.o.   MRN: [id number]  HPI  Chief Complaint  Patient presents with  . Hypertension    Patient comes in office today for follow up visit, last visit was 07/15/15. At last visit patient had poor control over blood pressure, blood pressure reading in office was 162/100. Patient was advised last visit to resume medication and return in 2-4 weeks. Patient reports poor compliance today with medication stating that he ran out of medication over 30 days ago and that medication did not help with symptom control. Today patient would like to doiscuss medication options.   States he recently had his DOT physical and did not pass due to his elevated bp.   Review of Systems  Respiratory: Negative for shortness of breath.   Cardiovascular: Negative for chest pain and palpitations.       Objective:   Physical Exam  Constitutional: He appears well-developed and well-nourished. No distress.  Cardiovascular: Normal rate and regular rhythm.   Pulmonary/Chest: Breath sounds normal.  Musculoskeletal: He exhibits no edema (of lower extremities).       Assessment:    1. Essential hypertensio - chlorthalidone (HYGROTON) 25 MG tablet; Take 1 tablet (25 mg total) by mouth daily.  Dispense: 30 tablet; Refill: 1    Plan:    Further f/u in 3 weeks; may call if not tolerating the medication

## 2015-10-28 NOTE — Patient Instructions (Signed)
Let me know if don't tolerate the medication.

## 2015-11-18 ENCOUNTER — Ambulatory Visit (INDEPENDENT_AMBULATORY_CARE_PROVIDER_SITE_OTHER): Payer: BLUE CROSS/BLUE SHIELD | Admitting: Family Medicine

## 2015-11-18 ENCOUNTER — Encounter: Payer: Self-pay | Admitting: Family Medicine

## 2015-11-18 VITALS — BP 146/100 | HR 60 | Temp 98.7°F | Resp 16 | Wt 270.6 lb

## 2015-11-18 DIAGNOSIS — I10 Essential (primary) hypertension: Secondary | ICD-10-CM

## 2015-11-18 MED ORDER — LOSARTAN POTASSIUM 50 MG PO TABS: 50.0000 mg | ORAL_TABLET | Freq: Every day | ORAL | 1 refills | Status: DC

## 2015-11-18 NOTE — Patient Instructions (Signed)
Avoid decongestants but can use Delsym, and Claritin or similar for post nasal drainage.

## 2015-11-18 NOTE — Progress Notes (Signed)
Subjective:     Patient ID: Jerome Graves, male   DOB: 10/08/1965, 50 y.o.   MRN: [id number]  HPI  Chief Complaint  Patient presents with  . Hypertension    Patient is present in office today for three week follow, last office visit 10/28/15 patient was started on Chlorthalidone 25mg . Patient blood pressure at last visit was 154/108, patient reports good compliance and tolerance on medication  States he has cold sx but has avoided otc medication. Reports his DOT physical is in 3 weeks.   Review of Systems     Objective:   Physical Exam  Constitutional: He appears well-developed and well-nourished. No distress.  Cardiovascular: Normal rate and regular rhythm.   Pulmonary/Chest: Breath sounds normal.  Musculoskeletal: He exhibits no edema (of lower extremities).       Assessment:    1. Essential hypertension: Continue chlorthalidone - losartan (COZAAR) 50 MG tablet; Take 1 tablet (50 mg total) by mouth daily.  Dispense: 30 tablet; Refill: 1    Plan:    Discussed otc cold medications that would not affect bp.

## 2015-12-02 ENCOUNTER — Ambulatory Visit (INDEPENDENT_AMBULATORY_CARE_PROVIDER_SITE_OTHER): Payer: BLUE CROSS/BLUE SHIELD | Admitting: Family Medicine

## 2015-12-02 ENCOUNTER — Encounter: Payer: Self-pay | Admitting: Family Medicine

## 2015-12-02 VITALS — BP 146/96 | HR 100 | Temp 98.1°F | Resp 16 | Wt 273.8 lb

## 2015-12-02 DIAGNOSIS — I10 Essential (primary) hypertension: Secondary | ICD-10-CM | POA: Diagnosis not present

## 2015-12-02 MED ORDER — LOSARTAN POTASSIUM 100 MG PO TABS: 100.0000 mg | ORAL_TABLET | Freq: Every day | ORAL | 0 refills | Status: DC

## 2015-12-02 NOTE — Progress Notes (Signed)
Subjective:     Patient ID: Jerome Graves, male   DOB: 16-Jan-1966, 50 y.o.   MRN: [id number]  HPI  Chief Complaint  Patient presents with  . Hypertension    Patient is present in office today for two week follow up, last office visit was 11/18/15. Patient blood pressure at last visit was 146/100, he was started on Losartan 50mg  and advised to continue Chlorthalidone. Patient reports that he is taking medication as prescribed and is tolerating well.   States he will have to update his DOT exam within 3 weeks.   Review of Systems     Objective:   Physical Exam  Constitutional: He appears well-developed and well-nourished. No distress.  Cardiovascular: Normal rate and regular rhythm.   Pulmonary/Chest: Breath sounds normal.  Musculoskeletal: He exhibits no edema (of lower extremities).       Assessment:    1. Essential hypertension: continue chlorthalidone - losartan (COZAAR) 100 MG tablet; Take 1 tablet (100 mg total) by mouth daily.  Dispense: 30 tablet; Refill: 0    Plan:    F/u in two weeks.

## 2015-12-02 NOTE — Patient Instructions (Signed)
You may use up the last of the 50 mg.pills by taking two daily.

## 2015-12-16 ENCOUNTER — Ambulatory Visit (INDEPENDENT_AMBULATORY_CARE_PROVIDER_SITE_OTHER): Payer: BLUE CROSS/BLUE SHIELD | Admitting: Family Medicine

## 2015-12-16 ENCOUNTER — Encounter: Payer: Self-pay | Admitting: Family Medicine

## 2015-12-16 VITALS — BP 118/92 | HR 68 | Temp 98.4°F | Resp 16 | Wt 269.4 lb

## 2015-12-16 DIAGNOSIS — I10 Essential (primary) hypertension: Secondary | ICD-10-CM

## 2015-12-16 MED ORDER — LOSARTAN POTASSIUM 100 MG PO TABS: 100.0000 mg | ORAL_TABLET | Freq: Every day | ORAL | 1 refills | Status: DC

## 2015-12-16 MED ORDER — CHLORTHALIDONE 25 MG PO TABS: 25.0000 mg | ORAL_TABLET | Freq: Every day | ORAL | 1 refills | Status: DC

## 2015-12-16 NOTE — Patient Instructions (Signed)
Return sooner if bp starts to get out of control. We will update your labs and recheck your bp in 6 months.

## 2015-12-16 NOTE — Progress Notes (Signed)
Subjective:     Patient ID: Jerome Graves, male   DOB: 1965/03/09, 50 y.o.   MRN: [id number]  HPI  Chief Complaint  Patient presents with  . Hypertension    Patient is present in office today for two week follow up, last office visit was 12/02/15. At last visit blood pressure in house was 146/96 patient was advised to continue Chlorthalidone and Losartan 100mg . Patient reports good compliance and tolerance of medication.   He is due to DOT physical in the next week. Reports getting home bp's in the 120/80 range.   Review of Systems     Objective:   Physical Exam  Constitutional: He appears well-developed and well-nourished. No distress.  Cardiovascular: Normal rate and regular rhythm.   Pulmonary/Chest: Breath sounds normal.  Musculoskeletal: He exhibits no edema (of lower extremities).       Assessment:    1. Essential hypertension - losartan (COZAAR) 100 MG tablet; Take 1 tablet (100 mg total) by mouth daily.  Dispense: 90 tablet; Refill: 1 - chlorthalidone (HYGROTON) 25 MG tablet; Take 1 tablet (25 mg total) by mouth daily.  Dispense: 90 tablet; Refill: 1    Plan:    Will update labs at followup in 6 months..Marland Kitchen

## 2017-01-18 ENCOUNTER — Other Ambulatory Visit: Payer: Self-pay | Admitting: Family Medicine

## 2017-01-18 DIAGNOSIS — I10 Essential (primary) hypertension: Secondary | ICD-10-CM

## 2017-06-28 ENCOUNTER — Ambulatory Visit (INDEPENDENT_AMBULATORY_CARE_PROVIDER_SITE_OTHER): Payer: BLUE CROSS/BLUE SHIELD | Admitting: Family Medicine

## 2017-06-28 ENCOUNTER — Encounter: Payer: Self-pay | Admitting: Family Medicine

## 2017-06-28 VITALS — BP 182/112 | HR 125 | Temp 98.4°F | Resp 16 | Ht 72.0 in | Wt 260.0 lb

## 2017-06-28 DIAGNOSIS — J0110 Acute frontal sinusitis, unspecified: Secondary | ICD-10-CM | POA: Diagnosis not present

## 2017-06-28 DIAGNOSIS — I10 Essential (primary) hypertension: Secondary | ICD-10-CM | POA: Diagnosis not present

## 2017-06-28 MED ORDER — CHLORTHALIDONE 25 MG PO TABS: 25.0000 mg | ORAL_TABLET | Freq: Every day | ORAL | 0 refills | Status: DC

## 2017-06-28 MED ORDER — AMOXICILLIN-POT CLAVULANATE 875-125 MG PO TABS: 1.0000 | ORAL_TABLET | Freq: Two times a day (BID) | ORAL | 0 refills | Status: DC

## 2017-06-28 MED ORDER — HYDROCODONE-HOMATROPINE 5-1.5 MG/5ML PO SYRP: 5.0000 mL | ORAL_SOLUTION | Freq: Four times a day (QID) | ORAL | 0 refills | Status: AC | PRN

## 2017-06-28 MED ORDER — HYDROCODONE-HOMATROPINE 5-1.5 MG/5ML PO SYRP: 5.0000 mL | ORAL_SOLUTION | Freq: Four times a day (QID) | ORAL | 0 refills | Status: DC | PRN

## 2017-06-28 MED ORDER — LOSARTAN POTASSIUM 50 MG PO TABS
50.0000 mg | ORAL_TABLET | Freq: Every day | ORAL | 0 refills | Status: DC
Start: 2017-06-28 — End: 2018-02-24

## 2017-06-28 NOTE — Progress Notes (Signed)
  Subjective:     Patient ID: Jerome Graves, male   DOB: 04-Apr-1965, 52 y.o.   MRN: [id number] Chief Complaint  Patient presents with  . URI    onset 1 month, syptoms include: sinus pressure, blowing out green/yellow snot, coughing, headacheschest/nasal congestion, SOB and wheezing.  . Medication Refill    has been out of bp med for 3 months  . Hypertension   HPI Patient reports increased frontal sinus pressure, purulent sinus drainage, post nasal drainage and accompanying cough. Has taken Mucinex D this AM. Review of Systems     Objective:   Physical Exam  Constitutional: He appears well-developed and well-nourished. No distress.  Cardiovascular: Regular rhythm.  tachycardic  Ears: T.M's intact without inflammation Sinuses: mild frontal sinus tendernes Throat: no tonsillar enlargement or exudate Neck: no cervical adenopathy Lungs: clear     Assessment:    1. Acute frontal sinusitis, recurrence not specified; rx for hydrocodone cough syrup - amoxicillin-clavulanate (AUGMENTIN) 875-125 MG tablet; Take 1 tablet by mouth 2 (two) times daily.  Dispense: 20 tablet; Refill: 0  2. Essential hypertension - chlorthalidone (HYGROTON) 25 MG tablet; Take 1 tablet (25 mg total) by mouth daily.  Dispense: 30 tablet; Refill: 0 - losartan (COZAAR) 50 MG tablet; Take 1 tablet (50 mg total) by mouth daily.  Dispense: 30 tablet; Refill: 0    Plan:    Avoid decongestants. Discussed use of saline spray, Mucinex, Delsym, and Claritin for nasal drip. F/u bp in 3 weeks.

## 2017-06-28 NOTE — Patient Instructions (Signed)
Discussed use of saline nasal spray and Mucinex. For nasal drip try Claritin and Delsym for cough

## 2017-07-19 ENCOUNTER — Encounter: Payer: Self-pay | Admitting: Family Medicine

## 2017-07-19 ENCOUNTER — Ambulatory Visit (INDEPENDENT_AMBULATORY_CARE_PROVIDER_SITE_OTHER): Payer: BLUE CROSS/BLUE SHIELD | Admitting: Family Medicine

## 2017-07-19 VITALS — BP 126/100 | HR 114 | Temp 98.6°F | Ht 72.0 in | Wt 248.1 lb

## 2017-07-19 DIAGNOSIS — I10 Essential (primary) hypertension: Secondary | ICD-10-CM

## 2017-07-19 NOTE — Patient Instructions (Signed)
Continue Losartan only. Continue your weight loss efforts. Come in for a nurse bp check and weight check in 3-4 weeks.

## 2017-07-19 NOTE — Progress Notes (Signed)
  Subjective:     Patient ID: Jerome Graves, male   DOB: 12/25/1965, 52 y.o.   MRN: [id number] Chief Complaint  Patient presents with  . Hypertension   HPI States he stopped both bp medications for a few days due to feeling bad and low bp (SBP 110).  States he lost taste during course of abx for sinusitis. He has lost 12 lbs since April eating more fruits and vegetables: "I am tired of being fat.) Just resumed both bp medications in the last two days.  Review of Systems     Objective:   Physical Exam  Constitutional: He appears well-developed and well-nourished. No distress.       Assessment:    1. Essential hypertension: improving    Plan:    Continue on Losartan alone and dietary food choices. Nurse bp and weight check in 3-4 weeks. Consider updating labs at next visit.

## 2017-10-05 DIAGNOSIS — L03116 Cellulitis of left lower limb: Secondary | ICD-10-CM | POA: Diagnosis not present

## 2017-10-05 DIAGNOSIS — Z8739 Personal history of other diseases of the musculoskeletal system and connective tissue: Secondary | ICD-10-CM | POA: Diagnosis not present

## 2017-10-05 DIAGNOSIS — M25562 Pain in left knee: Secondary | ICD-10-CM | POA: Diagnosis not present

## 2017-10-06 DIAGNOSIS — L03116 Cellulitis of left lower limb: Secondary | ICD-10-CM | POA: Diagnosis not present

## 2017-10-15 DIAGNOSIS — T792XXA Traumatic secondary and recurrent hemorrhage and seroma, initial encounter: Secondary | ICD-10-CM | POA: Diagnosis not present

## 2017-10-20 DIAGNOSIS — M7052 Other bursitis of knee, left knee: Secondary | ICD-10-CM | POA: Diagnosis not present

## 2017-10-20 DIAGNOSIS — M25562 Pain in left knee: Secondary | ICD-10-CM | POA: Diagnosis not present

## 2018-02-24 ENCOUNTER — Encounter: Payer: Self-pay | Admitting: Family Medicine

## 2018-02-24 ENCOUNTER — Other Ambulatory Visit: Payer: Self-pay

## 2018-02-24 ENCOUNTER — Ambulatory Visit (INDEPENDENT_AMBULATORY_CARE_PROVIDER_SITE_OTHER): Payer: BLUE CROSS/BLUE SHIELD | Admitting: Family Medicine

## 2018-02-24 VITALS — BP 180/108 | HR 121 | Temp 98.8°F | Ht 72.0 in | Wt 249.4 lb

## 2018-02-24 DIAGNOSIS — R6889 Other general symptoms and signs: Secondary | ICD-10-CM | POA: Diagnosis not present

## 2018-02-24 DIAGNOSIS — I10 Essential (primary) hypertension: Secondary | ICD-10-CM

## 2018-02-24 MED ORDER — LOSARTAN POTASSIUM 50 MG PO TABS: 50.0000 mg | ORAL_TABLET | Freq: Every day | ORAL | 0 refills | Status: DC

## 2018-02-24 MED ORDER — HYDROCODONE-HOMATROPINE 5-1.5 MG/5ML PO SYRP: 5.0000 mL | ORAL_SOLUTION | Freq: Four times a day (QID) | ORAL | 0 refills | Status: AC | PRN

## 2018-02-24 MED ORDER — HYDROCODONE-HOMATROPINE 5-1.5 MG/5ML PO SYRP: 5.0000 mL | ORAL_SOLUTION | Freq: Four times a day (QID) | ORAL | 0 refills | Status: DC | PRN

## 2018-02-24 NOTE — Patient Instructions (Signed)
Discussed use of Mucinex. Let me know if not improving over the next few days.

## 2018-02-24 NOTE — Progress Notes (Signed)
  Subjective:     Patient ID: Jerome Graves, male   DOB: 08/25/1965, 52 y.o.   MRN: [id number] Chief Complaint  Patient presents with  . flu like symptoms    since sunday 02/20/18   HPI Reports his niece was diagnosed with the flu and his wife who accompanies is sick as well. No flu shot this season. Non-compliant with bp follow up and has run out of medication months ago.  Review of Systems     Objective:   Physical Exam Constitutional:      General: He is not in acute distress.    Appearance: He is ill-appearing.   Ears: T.M's intact without inflammation Throat: no tonsillar enlargement or exudate Neck: no cervical adenopathy Lungs: Deep breathing provokes cough and expiratory wheezes.     Assessment:    1. Flu-like symptoms: wife's flu swab negative Hydrocodone cough syrup 2. Essential hypertension - losartan (COZAAR) 50 MG tablet; Take 1 tablet (50 mg total) by mouth daily.  Dispense: 90 tablet; Refill: 0    Plan:    Discussed use of Mucinex. Work excuse for 02/25/18. F/u hypertension in 4 weeks.

## 2018-03-22 ENCOUNTER — Telehealth: Payer: Self-pay

## 2018-03-22 NOTE — Telephone Encounter (Signed)
I was told patient wanted his blood pressure check. I Checked blood pressure on Left arm, sitting,large cuff 180/110. Provider was advised of reading and per our record patient is scheduled to come back on 03/24/2018 when I told patient this he reported that he thought his appointment was today at 8:00 am because that is what the his paper said and he received the phone call per provider his next appointment is at 10 am and patient stated he couldn't wait that long got up and left out of the room.

## 2018-03-24 ENCOUNTER — Ambulatory Visit: Payer: Self-pay | Admitting: Family Medicine

## 2018-03-28 ENCOUNTER — Ambulatory Visit (INDEPENDENT_AMBULATORY_CARE_PROVIDER_SITE_OTHER): Payer: BLUE CROSS/BLUE SHIELD | Admitting: Family Medicine

## 2018-03-28 ENCOUNTER — Encounter: Payer: Self-pay | Admitting: Family Medicine

## 2018-03-28 ENCOUNTER — Other Ambulatory Visit: Payer: Self-pay

## 2018-03-28 VITALS — BP 198/130 | HR 114 | Temp 98.2°F | Ht 72.0 in | Wt 253.8 lb

## 2018-03-28 DIAGNOSIS — I10 Essential (primary) hypertension: Secondary | ICD-10-CM

## 2018-03-28 DIAGNOSIS — J019 Acute sinusitis, unspecified: Secondary | ICD-10-CM | POA: Diagnosis not present

## 2018-03-28 MED ORDER — AMOXICILLIN-POT CLAVULANATE 875-125 MG PO TABS
1.0000 | ORAL_TABLET | Freq: Two times a day (BID) | ORAL | 0 refills | Status: DC
Start: 2018-03-28 — End: 2018-04-25

## 2018-03-28 MED ORDER — CHLORTHALIDONE 25 MG PO TABS: 25.0000 mg | ORAL_TABLET | Freq: Every day | ORAL | 1 refills | Status: DC

## 2018-03-28 NOTE — Patient Instructions (Signed)
Let me know if sinuses not improving with the Augmentin.

## 2018-03-28 NOTE — Progress Notes (Signed)
  Subjective:     Patient ID: Jerome Graves, male   DOB: Aug 23, 1965, 53 y.o.   MRN: [id number] Chief Complaint  Patient presents with  . Hypertension    follow up   HPI Patient reports increased sinus pressure, purulent sinus drainage, post nasal drainage and accompanying cough since flu like illness 12/26.  Review of Systems     Objective:   Physical Exam Constitutional:      General: He is not in acute distress.    Appearance: He is not ill-appearing.  Cardiovascular:     Rate and Rhythm: Regular rhythm. Tachycardia present.  Neurological:     Mental Status: He is alert.   Ears: T.M's intact without inflammation Sinuses: non-tender Throat: no tonsillar enlargement or exudate Neck: no cervical adenopathy Lungs: clear     Assessment:    1. Essential hypertension: continue Losartan - chlorthalidone (HYGROTON) 25 MG tablet; Take 1 tablet (25 mg total) by mouth daily.  Dispense: 30 tablet; Refill: 1  2. Acute non-recurrent sinusitis, unspecified location - amoxicillin-clavulanate (AUGMENTIN) 875-125 MG tablet; Take 1 tablet by mouth 2 (two) times daily.  Dispense: 20 tablet; Refill: 0    Plan:    May call if not improving with the above. BP f/u in 4 weeks.

## 2018-04-25 ENCOUNTER — Encounter: Payer: Self-pay | Admitting: Family Medicine

## 2018-04-25 ENCOUNTER — Ambulatory Visit (INDEPENDENT_AMBULATORY_CARE_PROVIDER_SITE_OTHER): Payer: BLUE CROSS/BLUE SHIELD | Admitting: Family Medicine

## 2018-04-25 VITALS — BP 146/102 | HR 121 | Temp 98.5°F | Resp 16 | Wt 245.8 lb

## 2018-04-25 DIAGNOSIS — I10 Essential (primary) hypertension: Secondary | ICD-10-CM

## 2018-04-25 MED ORDER — LOSARTAN POTASSIUM 100 MG PO TABS: 100.0000 mg | ORAL_TABLET | Freq: Every day | ORAL | 0 refills | Status: DC

## 2018-04-25 MED ORDER — CHLORTHALIDONE 25 MG PO TABS: 25.0000 mg | ORAL_TABLET | Freq: Every day | ORAL | 0 refills | Status: DC

## 2018-04-25 NOTE — Patient Instructions (Signed)
Do follow up with provider of your choice. Best wishes for the future

## 2018-04-25 NOTE — Progress Notes (Signed)
  Subjective:     Patient ID: Jerome Graves, male   DOB: 1965/06/29, 53 y.o.   MRN: [id number] Chief Complaint  Patient presents with  . Hypertension    Patient returns back to office for one month follow up, patient was last seen 03/28/18 blood pressure in office was 198/130. At last visit patient was advised to continue Losartan and to start Chlorthalidone 25mg  qd. Patient reports good compliance and tolerance on medication.    HPI   Review of Systems     Objective:   Physical Exam Constitutional:      General: He is not in acute distress. Cardiovascular:     Rate and Rhythm: Tachycardia present.  Pulmonary:     Breath sounds: Normal breath sounds.  Musculoskeletal:     Right lower leg: No edema.     Left lower leg: No edema.  Neurological:     Mental Status: He is alert.        Assessment:    1. Essential hypertension: will increase losartan - losartan (COZAAR) 100 MG tablet; Take 1 tablet (100 mg total) by mouth daily.  Dispense: 90 tablet; Refill: 0 - chlorthalidone (HYGROTON) 25 MG tablet; Take 1 tablet (25 mg total) by mouth daily.  Dispense: 90 tablet; Refill: 0    Plan:    Will establish with a new provider in 4 weeks due to my retirement.

## 2018-05-23 ENCOUNTER — Ambulatory Visit: Payer: Self-pay | Admitting: Family Medicine

## 2018-07-26 ENCOUNTER — Other Ambulatory Visit: Payer: Self-pay | Admitting: Family Medicine

## 2018-07-26 DIAGNOSIS — I10 Essential (primary) hypertension: Secondary | ICD-10-CM

## 2018-07-26 MED ORDER — LOSARTAN POTASSIUM 100 MG PO TABS: 100.0000 mg | ORAL_TABLET | Freq: Every day | ORAL | 0 refills | Status: DC

## 2018-07-26 MED ORDER — CHLORTHALIDONE 25 MG PO TABS: 25.0000 mg | ORAL_TABLET | Freq: Every day | ORAL | 0 refills | Status: DC

## 2018-07-26 NOTE — Telephone Encounter (Signed)
[facility] faxed refill request for the following medications:  1. losartan (COZAAR) 100 MG tablet 2. chlorthalidone (HYGROTON) 25 MG tablet  90 day supply  Pt was seeing Nadine Counts. Pt was scheduled to see Dr. Sherrie Mustache March 2020 but pt didn't show for the appt. Please advise. Thanks TNP

## 2019-02-06 ENCOUNTER — Other Ambulatory Visit: Payer: Self-pay | Admitting: Family Medicine

## 2019-02-06 DIAGNOSIS — I10 Essential (primary) hypertension: Secondary | ICD-10-CM

## 2019-02-06 NOTE — Telephone Encounter (Signed)
Requested medication (s) are due for refill today: yes  Requested medication (s) are on the active medication list: yes  Last refill:  01/03/2019  Future visit scheduled: no  Notes to clinic:  Overdue for office visit    Requested Prescriptions  Pending Prescriptions Disp Refills   losartan (COZAAR) 100 MG tablet [Pharmacy Med Name: LOSARTAN 100MG  TABLETS] 30 tablet 0    Sig: TAKE 1 TABLET(100 MG) BY MOUTH DAILY     Cardiovascular:  Angiotensin Receptor Blockers Failed - 02/06/2019  3:20 AM      Failed - Cr in normal range and within 180 days    Creatinine  Date Value Ref Range Status  08/08/2013 1.10 0.60 - 1.30 mg/dL Final   Creatinine, Ser  Date Value Ref Range Status  09/13/2013 0.90 0.50 - 1.35 mg/dL Final         Failed - K in normal range and within 180 days    Potassium  Date Value Ref Range Status  09/13/2013 3.8 3.7 - 5.3 mEq/L Final  08/08/2013 3.5 3.5 - 5.1 mmol/L Final         Failed - Last BP in normal range    BP Readings from Last 1 Encounters:  04/25/18 (!) 146/102         Failed - Valid encounter within last 6 months    Recent Outpatient Visits          9 months ago Essential hypertension   [facility] [place], [place]   10 months ago Essential hypertension   [place], [place], [place]   11 months ago Flu-like symptoms   [place], [place], [place]   1 year ago Essential hypertension   [place], [place]   1 year ago Acute frontal sinusitis, recurrence not specified   [facility] [place], [place] - Patient is not pregnant

## 2019-02-14 ENCOUNTER — Other Ambulatory Visit: Payer: Self-pay | Admitting: Family Medicine

## 2019-02-14 DIAGNOSIS — I10 Essential (primary) hypertension: Secondary | ICD-10-CM

## 2019-02-14 NOTE — Telephone Encounter (Signed)
Requested medication (s) are due for refill today: yes  Requested medication (s) are on the active medication list: yes  Last refill:  01/10/2019  Future visit scheduled: no  Notes to clinic:  LOV-04/25/2018 Review for refill   Requested Prescriptions  Pending Prescriptions Disp Refills   chlorthalidone (HYGROTON) 25 MG tablet [Pharmacy Med Name: CHLORTHALIDONE 25MG  TABLETS] 30 tablet 0    Sig: TAKE 1 TABLET(25 MG) BY MOUTH DAILY      Cardiovascular: Diuretics - Thiazide Failed - 02/14/2019  3:19 AM      Failed - Ca in normal range and within 360 days    Calcium, Total  Date Value Ref Range Status  08/08/2013 8.6 8.5 - 10.1 mg/dL Final   Calcium, Ion  Date Value Ref Range Status  09/13/2013 1.27 (H) 1.12 - 1.23 mmol/L Final          Failed - Cr in normal range and within 360 days    Creatinine  Date Value Ref Range Status  08/08/2013 1.10 0.60 - 1.30 mg/dL Final   Creatinine, Ser  Date Value Ref Range Status  09/13/2013 0.90 0.50 - 1.35 mg/dL Final          Failed - K in normal range and within 360 days    Potassium  Date Value Ref Range Status  09/13/2013 3.8 3.7 - 5.3 mEq/L Final  08/08/2013 3.5 3.5 - 5.1 mmol/L Final          Failed - Na in normal range and within 360 days    Sodium  Date Value Ref Range Status  09/13/2013 145 137 - 147 mEq/L Final  08/08/2013 139 136 - 145 mmol/L Final          Failed - Last BP in normal range    BP Readings from Last 1 Encounters:  04/25/18 (!) 146/102          Failed - Valid encounter within last 6 months    Recent Outpatient Visits           9 months ago Essential hypertension   [facility] [place], [place]   10 months ago Essential hypertension   [place], [place], [place]   11 months ago Flu-like symptoms   [place], [place], [place]   1 year ago Essential hypertension   [facility] [place], [place]   1 year ago Acute frontal  sinusitis, recurrence not specified   [place], [place], [place]

## 2019-02-16 NOTE — Telephone Encounter (Signed)
Patient is due for follow up office visit. Left message to call back.

## 2019-03-23 ENCOUNTER — Ambulatory Visit: Payer: BC Managed Care – PPO | Attending: Internal Medicine

## 2019-03-23 ENCOUNTER — Other Ambulatory Visit: Payer: Self-pay

## 2019-03-23 DIAGNOSIS — Z20822 Contact with and (suspected) exposure to covid-19: Secondary | ICD-10-CM

## 2019-03-24 LAB — NOVEL CORONAVIRUS, NAA: SARS-CoV-2, NAA: NOT DETECTED

## 2019-07-24 ENCOUNTER — Other Ambulatory Visit: Payer: Self-pay

## 2019-07-24 ENCOUNTER — Encounter: Payer: Self-pay | Admitting: Family Medicine

## 2019-07-24 ENCOUNTER — Ambulatory Visit (INDEPENDENT_AMBULATORY_CARE_PROVIDER_SITE_OTHER): Payer: BLUE CROSS/BLUE SHIELD | Admitting: Family Medicine

## 2019-07-24 VITALS — BP 192/112 | HR 122 | Temp 96.6°F | Wt 247.0 lb

## 2019-07-24 DIAGNOSIS — B029 Zoster without complications: Secondary | ICD-10-CM

## 2019-07-24 DIAGNOSIS — I10 Essential (primary) hypertension: Secondary | ICD-10-CM

## 2019-07-24 MED ORDER — CHLORTHALIDONE 25 MG PO TABS: ORAL_TABLET | ORAL | 1 refills | Status: DC

## 2019-07-24 MED ORDER — LOSARTAN POTASSIUM 100 MG PO TABS: ORAL_TABLET | ORAL | 1 refills | Status: DC

## 2019-07-24 MED ORDER — VALACYCLOVIR HCL 1 G PO TABS: 1000.0000 mg | ORAL_TABLET | Freq: Two times a day (BID) | ORAL | 0 refills | Status: DC

## 2019-07-24 NOTE — Progress Notes (Signed)
Acute Office Visit  Subjective:    Patient ID: Jerome Graves, male    DOB: 09-Jul-1965, 54 y.o.   MRN: [id number]  Chief Complaint  Patient presents with  . Dermatitis    HPI Patient is in today for possible contact dermatitis present on his ears, side of neck, around the back of his head and up into his hair. He only has it on the right side of his head.   He states he broke out on "Friday after he weeded on Thursday.  He is holding his mask in place as his ears and neck are so irritated he is unable to allow the straps to go around them.  His ear is so swollen that he is having trouble hearing.  He does have several blisters throughout the area.  It is of note that the patient reports being out of his blood pressure medication for over 1 month.  He states at home he has been getting readings of 190's over 110-120   Past Medical History:  Diagnosis Date  . GERD (gastroesophageal reflux disease)   . Gross hematuria   . History of kidney stones   . Hypertension   . Renal calculus, right   . Right flank pain     Past Surgical History:  Procedure Laterality Date  . CYSTOSCOPY WITH RETROGRADE PYELOGRAM, URETEROSCOPY AND STENT PLACEMENT Bilateral 09/13/2013   Procedure: CYSTOSCOPY WITH BILATERAL RETROGRADE PYELOGRAM, RIGHT URETEROSCOPY AND STENT PLACEMENT;  Surgeon: Sebastian Ache, MD;  Location: [facility];  Service: Urology;  Laterality: Bilateral;  . HOLMIUM LASER APPLICATION Right 09/13/2013   Procedure: HOLMIUM LASER APPLICATION;  Surgeon: Sebastian Ache, MD;  Location: [facility];  Service: Urology;  Laterality: Right;  . NEGATIVE SLEEP STUDY     2003 -- PER PT  . PERCUTANEOUS NEPHROLITHOTRIPSY Left 2010  . PINNING RIGHT RING FINGER FX  YRS AGO    Family History  Problem Relation Age of Onset  . Cancer Father     Social History   Socioeconomic History  . Marital status: Married    Spouse name: Not on file  . Number of children: Not  on file  . Years of education: Not on file  . Highest education level: Not on file  Occupational History  . Not on file  Tobacco Use  . Smoking status: Never Smoker  . Smokeless tobacco: Never Used  Substance and Sexual Activity  . Alcohol use: Yes    Alcohol/week: 2.0 standard drinks    Types: 2 Standard drinks or equivalent per week  . Drug use: No  . Sexual activity: Not on file  Other Topics Concern  . Not on file  Social History Narrative  . Not on file   Social Determinants of Health   Financial Resource Strain:   . Difficulty of Paying Living Expenses:   Food Insecurity:   . Worried About Programme researcher, broadcasting/film/video in the Last Year:   . Barista in the Last Year:   Transportation Needs:   . Freight forwarder (Medical):   Marland Kitchen Lack of Transportation (Non-Medical):   Physical Activity:   . Days of Exercise per Week:   . Minutes of Exercise per Session:   Stress:   . Feeling of Stress :   Social Connections:   . Frequency of Communication with Friends and Family:   . Frequency of Social Gatherings with Friends and Family:   . Attends Religious Services:   .  Active Member of Clubs or Organizations:   . Attends Archivist Meetings:   Marland Kitchen Marital Status:   Intimate Partner Violence:   . Fear of Current or Ex-Partner:   . Emotionally Abused:   Marland Kitchen Physically Abused:   . Sexually Abused:     Outpatient Medications Prior to Visit  Medication Sig Dispense Refill  . chlorthalidone (HYGROTON) 25 MG tablet TAKE 1 TABLET(25 MG) BY MOUTH DAILY 30 tablet 1  . losartan (COZAAR) 100 MG tablet TAKE 1 TABLET(100 MG) BY MOUTH DAILY 30 tablet 1   No facility-administered medications prior to visit.    No Known Allergies  Review of Systems  Skin: Positive for color change (pink to red) and rash.      Objective:    Physical Exam Constitutional:      General: He is not in acute distress.    Appearance: He is well-developed.  HENT:     Head: Normocephalic and  atraumatic.     Right Ear: Hearing normal.     Left Ear: Hearing normal.     Nose: Nose normal.  Eyes:     General: Lids are normal. No scleral icterus.       Right eye: No discharge.        Left eye: No discharge.     Conjunctiva/sclera: Conjunctivae normal.  Cardiovascular:     Rate and Rhythm: Tachycardia present.     Heart sounds: Normal heart sounds.  Pulmonary:     Effort: Pulmonary effort is normal. No respiratory distress.     Breath sounds: Normal breath sounds.  Musculoskeletal:        General: Normal range of motion.  Skin:    Findings: Rash present. No lesion.     Comments: Redness around large cluster of blisters on the right side of neck. Involves the posterior neck, right ear canal, anterior neck, right chin and upper sternal area. Tenderness to any touch.  Neurological:     Mental Status: He is alert and oriented to person, place, and time.  Psychiatric:        Speech: Speech normal.        Behavior: Behavior normal.        Thought Content: Thought content normal.     BP (!) 192/112 (BP Location: Right Arm, Patient Position: Sitting, Cuff Size: Normal)   Pulse (!) 122   Temp (!) 96.6 F (35.9 C) (Skin)   Wt 247 lb (112 kg)   SpO2 98%   BMI 33.50 kg/m  Wt Readings from Last 3 Encounters:  07/24/19 247 lb (112 kg)  04/25/18 245 lb 12.8 oz (111.5 kg)  03/28/18 253 lb 12.8 oz (115.1 kg)    Health Maintenance Due  Topic Date Due  . COVID-19 Vaccine (1) Never done  . HIV Screening  Never done  . TETANUS/TDAP  Never done  . COLONOSCOPY  Never done    There are no preventive care reminders to display for this patient.   No results found for: TSH Lab Results  Component Value Date   WBC 8.3 08/08/2013   HGB 17.0 09/13/2013   HCT 50.0 09/13/2013   MCV 88 08/08/2013   PLT 199 08/08/2013   Lab Results  Component Value Date   NA 145 09/13/2013   K 3.8 09/13/2013   CO2 22 08/08/2013   GLUCOSE 103 (H) 09/13/2013   BUN 13 09/13/2013   CREATININE  0.90 09/13/2013   BILITOT 0.9 08/08/2013   ALKPHOS 93 08/08/2013  AST 32 08/08/2013   ALT 48 08/08/2013   PROT 6.9 08/08/2013   ALBUMIN 3.6 08/08/2013   CALCIUM 8.6 08/08/2013   ANIONGAP 7 08/08/2013       Assessment & Plan:   1. Herpes zoster without complication Onset over the past 3 days with clusters of blisters and pain in the upper right cervical dermatome from the back of his neck, to ear, chin and chest. Treat with Valtrex and recheck in 10 days. - valACYclovir (VALTREX) 1000 MG tablet; Take 1 tablet (1,000 mg total) by mouth 2 (two) times daily.  Dispense: 20 tablet; Refill: 0 - CBC with Differential/Platelet  2. Essential hypertension Has been off his antihypertensive medications for several months. No dyspnea or chest pains. Pulse and BP very high today. Will restart Losartan and Chlorthalidone. Recheck CBC, CMP and TSH then recheck BP in 10 days. - chlorthalidone (HYGROTON) 25 MG tablet; TAKE 1 TABLET(25 MG) BY MOUTH DAILY  Dispense: 90 tablet; Refill: 1 - losartan (COZAAR) 100 MG tablet; TAKE 1 TABLET(100 MG) BY MOUTH DAILY  Dispense: 90 tablet; Refill: 1 - CBC with Differential/Platelet - Comprehensive metabolic panel - TSH       Adline Peals, CMA

## 2019-07-24 NOTE — Patient Instructions (Signed)
DASH Eating Plan DASH stands for "Dietary Approaches to Stop Hypertension." The DASH eating plan is a healthy eating plan that has been shown to reduce high blood pressure (hypertension). It may also reduce your risk for type 2 diabetes, heart disease, and stroke. The DASH eating plan may also help with weight loss. What are tips for following this plan?  General guidelines  Avoid eating more than 2,300 mg (milligrams) of salt (sodium) a day. If you have hypertension, you may need to reduce your sodium intake to 1,500 mg a day.  Limit alcohol intake to no more than 1 drink a day for nonpregnant women and 2 drinks a day for men. One drink equals 12 oz of beer, 5 oz of wine, or 1 oz of hard liquor.  Work with your health care provider to maintain a healthy body weight or to lose weight. Ask what an ideal weight is for you.  Get at least 30 minutes of exercise that causes your heart to beat faster (aerobic exercise) most days of the week. Activities may include walking, swimming, or biking.  Work with your health care provider or diet and nutrition specialist (dietitian) to adjust your eating plan to your individual calorie needs. Reading food labels   Check food labels for the amount of sodium per serving. Choose foods with less than 5 percent of the Daily Value of sodium. Generally, foods with less than 300 mg of sodium per serving fit into this eating plan.  To find whole grains, look for the word "whole" as the first word in the ingredient list. Shopping  Buy products labeled as "low-sodium" or "no salt added."  Buy fresh foods. Avoid canned foods and premade or frozen meals. Cooking  Avoid adding salt when cooking. Use salt-free seasonings or herbs instead of table salt or sea salt. Check with your health care provider or pharmacist before using salt substitutes.  Do not fry foods. Cook foods using healthy methods such as baking, boiling, grilling, and broiling instead.  Cook with  heart-healthy oils, such as olive, canola, soybean, or sunflower oil. Meal planning  Eat a balanced diet that includes: ? 5 or more servings of fruits and vegetables each day. At each meal, try to fill half of your plate with fruits and vegetables. ? Up to 6-8 servings of whole grains each day. ? Less than 6 oz of lean meat, poultry, or fish each day. A 3-oz serving of meat is about the same size as a deck of cards. One egg equals 1 oz. ? 2 servings of low-fat dairy each day. ? A serving of nuts, seeds, or beans 5 times each week. ? Heart-healthy fats. Healthy fats called Omega-3 fatty acids are found in foods such as flaxseeds and coldwater fish, like sardines, salmon, and mackerel.  Limit how much you eat of the following: ? Canned or prepackaged foods. ? Food that is high in trans fat, such as fried foods. ? Food that is high in saturated fat, such as fatty meat. ? Sweets, desserts, sugary drinks, and other foods with added sugar. ? Full-fat dairy products.  Do not salt foods before eating.  Try to eat at least 2 vegetarian meals each week.  Eat more home-cooked food and less restaurant, buffet, and fast food.  When eating at a restaurant, ask that your food be prepared with less salt or no salt, if possible. What foods are recommended? The items listed may not be a complete list. Talk with your dietitian about   what dietary choices are best for you. Grains Whole-grain or whole-wheat bread. Whole-grain or whole-wheat pasta. Brown rice. Oatmeal. Quinoa. Bulgur. Whole-grain and low-sodium cereals. Pita bread. Low-fat, low-sodium crackers. Whole-wheat flour tortillas. Vegetables Fresh or frozen vegetables (raw, steamed, roasted, or grilled). Low-sodium or reduced-sodium tomato and vegetable juice. Low-sodium or reduced-sodium tomato sauce and tomato paste. Low-sodium or reduced-sodium canned vegetables. Fruits All fresh, dried, or frozen fruit. Canned fruit in natural juice (without  added sugar). Meat and other protein foods Skinless chicken or turkey. Ground chicken or turkey. Pork with fat trimmed off. Fish and seafood. Egg whites. Dried beans, peas, or lentils. Unsalted nuts, nut butters, and seeds. Unsalted canned beans. Lean cuts of beef with fat trimmed off. Low-sodium, lean deli meat. Dairy Low-fat (1%) or fat-free (skim) milk. Fat-free, low-fat, or reduced-fat cheeses. Nonfat, low-sodium ricotta or cottage cheese. Low-fat or nonfat yogurt. Low-fat, low-sodium cheese. Fats and oils Soft margarine without trans fats. Vegetable oil. Low-fat, reduced-fat, or light mayonnaise and salad dressings (reduced-sodium). Canola, safflower, olive, soybean, and sunflower oils. Avocado. Seasoning and other foods Herbs. Spices. Seasoning mixes without salt. Unsalted popcorn and pretzels. Fat-free sweets. What foods are not recommended? The items listed may not be a complete list. Talk with your dietitian about what dietary choices are best for you. Grains Baked goods made with fat, such as croissants, muffins, or some breads. Dry pasta or rice meal packs. Vegetables Creamed or fried vegetables. Vegetables in a cheese sauce. Regular canned vegetables (not low-sodium or reduced-sodium). Regular canned tomato sauce and paste (not low-sodium or reduced-sodium). Regular tomato and vegetable juice (not low-sodium or reduced-sodium). Pickles. Olives. Fruits Canned fruit in a light or heavy syrup. Fried fruit. Fruit in cream or butter sauce. Meat and other protein foods Fatty cuts of meat. Ribs. Fried meat. Bacon. Sausage. Bologna and other processed lunch meats. Salami. Fatback. Hotdogs. Bratwurst. Salted nuts and seeds. Canned beans with added salt. Canned or smoked fish. Whole eggs or egg yolks. Chicken or turkey with skin. Dairy Whole or 2% milk, cream, and half-and-half. Whole or full-fat cream cheese. Whole-fat or sweetened yogurt. Full-fat cheese. Nondairy creamers. Whipped toppings.  Processed cheese and cheese spreads. Fats and oils Butter. Stick margarine. Lard. Shortening. Ghee. Bacon fat. Tropical oils, such as coconut, palm kernel, or palm oil. Seasoning and other foods Salted popcorn and pretzels. Onion salt, garlic salt, seasoned salt, table salt, and sea salt. Worcestershire sauce. Tartar sauce. Barbecue sauce. Teriyaki sauce. Soy sauce, including reduced-sodium. Steak sauce. Canned and packaged gravies. Fish sauce. Oyster sauce. Cocktail sauce. Horseradish that you find on the shelf. Ketchup. Mustard. Meat flavorings and tenderizers. Bouillon cubes. Hot sauce and Tabasco sauce. Premade or packaged marinades. Premade or packaged taco seasonings. Relishes. Regular salad dressings. Where to find more information:  National Heart, Lung, and Blood Institute: www.nhlbi.nih.gov  American Heart Association: www.heart.org Summary  The DASH eating plan is a healthy eating plan that has been shown to reduce high blood pressure (hypertension). It may also reduce your risk for type 2 diabetes, heart disease, and stroke.  With the DASH eating plan, you should limit salt (sodium) intake to 2,300 mg a day. If you have hypertension, you may need to reduce your sodium intake to 1,500 mg a day.  When on the DASH eating plan, aim to eat more fresh fruits and vegetables, whole grains, lean proteins, low-fat dairy, and heart-healthy fats.  Work with your health care provider or diet and nutrition specialist (dietitian) to adjust your eating plan to your   individual calorie needs. This information is not intended to replace advice given to you by your health care provider. Make sure you discuss any questions you have with your health care provider. Document Revised: 01/29/2017 Document Reviewed: 02/10/2016 Elsevier Patient Education  2020 Elsevier Inc.  

## 2019-08-01 NOTE — Progress Notes (Deleted)
     Established patient visit   Patient: Jerome Graves   DOB: [birth year]   54 y.o. Male  MRN: [id number] Visit Date: 08/03/2019  Today's healthcare provider: Dortha Kern, PA   No chief complaint on file.  Subjective    HPI  Hypertension, follow-up  BP Readings from Last 3 Encounters:  07/24/19 (!) 192/112  04/25/18 (!) 146/102  03/28/18 (!) 198/130   Wt Readings from Last 3 Encounters:  07/24/19 247 lb (112 kg)  04/25/18 245 lb 12.8 oz (111.5 kg)  03/28/18 253 lb 12.8 oz (115.1 kg)     He was last seen for hypertension on 07/24/19. BP at that visit was to re-start his Chlorhtalidone and Losartan.  He was instructed to follow up today.  He reports {excellent/good/fair/poor:19665} compliance with treatment. He {is/is not:9024} having side effects. {document side effects if present:1} He is following a {diet:21022986} diet. He {is/is not:9024} exercising. He {does/does not:200015} smoke.  Use of agents associated with hypertension: {bp agents assoc with hypertension:511::"none"}.   Outside blood pressures are {***enter patient reported home BP readings, or 'not being checked':1}. Symptoms: {Yes/No:20286} chest pain {Yes/No:20286} chest pressure  {Yes/No:20286} palpitations {Yes/No:20286} syncope  {Yes/No:20286} dyspnea {Yes/No:20286} orthopnea  {Yes/No:20286} paroxysmal nocturnal dyspnea {Yes/No:20286} lower extremity edema   Pertinent labs: No results found for: CHOL, HDL, LDLCALC, LDLDIRECT, TRIG, CHOLHDL Lab Results  Component Value Date   NA 145 09/13/2013   K 3.8 09/13/2013   CREATININE 0.90 09/13/2013   GFRNONAA >60 08/08/2013   GFRAA >60 08/08/2013   GLUCOSE 103 (H) 09/13/2013     The ASCVD Risk score Denman George DC Jr., et al., 2013) failed to calculate for the following reasons:   Cannot find a previous HDL lab   Cannot find a previous total cholesterol lab    --------------------------------------------------------------------------------------------------- [place] of the face-patient is also here to follow up after starting treatment for shingles.    Medications: Outpatient Medications Prior to Visit  Medication Sig  . chlorthalidone (HYGROTON) 25 MG tablet TAKE 1 TABLET(25 MG) BY MOUTH DAILY  . losartan (COZAAR) 100 MG tablet TAKE 1 TABLET(100 MG) BY MOUTH DAILY  . valACYclovir (VALTREX) 1000 MG tablet Take 1 tablet (1,000 mg total) by mouth 2 (two) times daily.   No facility-administered medications prior to visit.    Review of Systems  {Heme  Chem  Endocrine  Serology  Results Review (optional):23779::" "}  Objective    There were no vitals taken for this visit. {Show previous vital signs (optional):23777::" "}  Physical Exam   Depression screen [facility] 2/9 03/28/2018 06/28/2017  Decreased Interest 0 0  Down, Depressed, Hopeless 0 0  PHQ - 2 Score 0 0   Fall Risk  03/28/2018 06/28/2017  Falls in the past year? 0 No     Functional Status Survey:          No results found for any visits on 08/03/19.  Assessment & Plan     ***  No follow-ups on file.      {provider attestation***:1}   Dortha Kern, PA  [facility] [phone] (phone) [phone] (fax)  [facility]

## 2019-08-01 NOTE — Assessment & Plan Note (Deleted)
07/24/19 Re-started on Losartan and Chlorthalidone

## 2019-08-03 ENCOUNTER — Ambulatory Visit: Payer: Self-pay | Admitting: Family Medicine

## 2020-01-23 ENCOUNTER — Other Ambulatory Visit: Payer: Self-pay | Admitting: Family Medicine

## 2020-01-23 DIAGNOSIS — I10 Essential (primary) hypertension: Secondary | ICD-10-CM

## 2020-01-23 NOTE — Telephone Encounter (Signed)
Requested medication (s) are due for refill today: yes   Requested medication (s) are on the active medication list: yes   Last refill: 10/20/2019  Future visit scheduled: no  Notes to clinic:  overdue for follow up Message left for patient to callback and schedule    Requested Prescriptions  Pending Prescriptions Disp Refills   losartan (COZAAR) 100 MG tablet [Pharmacy Med Name: LOSARTAN 100MG  TABLETS] 90 tablet 1    Sig: TAKE 1 TABLET(100 MG) BY MOUTH DAILY      Cardiovascular:  Angiotensin Receptor Blockers Failed - 01/23/2020  3:14 AM      Failed - Cr in normal range and within 180 days    Creatinine  Date Value Ref Range Status  08/08/2013 1.10 0.60 - 1.30 mg/dL Final   Creatinine, Ser  Date Value Ref Range Status  09/13/2013 0.90 0.50 - 1.35 mg/dL Final          Failed - K in normal range and within 180 days    Potassium  Date Value Ref Range Status  09/13/2013 3.8 3.7 - 5.3 mEq/L Final  08/08/2013 3.5 3.5 - 5.1 mmol/L Final          Failed - Last BP in normal range    BP Readings from Last 1 Encounters:  07/24/19 (!) 192/112          Failed - Valid encounter within last 6 months    Recent Outpatient Visits           6 months ago Herpes zoster without complication   [facility] [place], [facility] [place]   1 year ago Essential hypertension   [facility] [place], JESSHE[place]   1 year ago Essential hypertension   [facility] [place], [place]ESS[place]   1 year ago Flu-like symptoms   [facility] [place], [place]   2 years ago Essential hypertension   [facility] [place], [place]              [place] - Patient is not pregnant

## 2020-01-24 ENCOUNTER — Other Ambulatory Visit: Payer: Self-pay | Admitting: Family Medicine

## 2020-01-24 DIAGNOSIS — I10 Essential (primary) hypertension: Secondary | ICD-10-CM

## 2020-03-05 ENCOUNTER — Encounter: Payer: Self-pay | Admitting: Family Medicine

## 2020-03-06 ENCOUNTER — Ambulatory Visit: Payer: Self-pay | Admitting: *Deleted

## 2020-03-06 NOTE — Telephone Encounter (Signed)
Positive Covid home test on 03/04/20. Constant dry cough. Denies difficulty breathing. Mild nasal congestion. Chills/sweats no measured fever.  Advice: increase water intake, coricidin HBP, warm tea with honey. Virtual appointment tomorrow.  Reason for Disposition . Cough  Answer Assessment - Initial Assessment Questions 1. ONSET: "When did the cough begin?"      Friday 5 days ago 2. SEVERITY: "How bad is the cough today?"      constant 3. SPUTUM: "Describe the color of your sputum" (none, dry cough; clear, white, yellow, green)     none 4. HEMOPTYSIS: "Are you coughing up any blood?" If so ask: "How much?" (flecks, streaks, tablespoons, etc.)     no 5. DIFFICULTY BREATHING: "Are you having difficulty breathing?" If Yes, ask: "How bad is it?" (e.g., mild, moderate, severe)    - MILD: No SOB at rest, mild SOB with walking, speaks normally in sentences, can lay down, no retractions, pulse < 100.    - MODERATE: SOB at rest, SOB with minimal exertion and prefers to sit, cannot lie down flat, speaks in phrases, mild retractions, audible wheezing, pulse 100-120.    - SEVERE: Very SOB at rest, speaks in single words, struggling to breathe, sitting hunched forward, retractions, pulse > 120      none 6. FEVER: "Do you have a fever?" If Yes, ask: "What is your temperature, how was it measured, and when did it start?"     No measured temparature but positive for chills/sweating 7. CARDIAC HISTORY: "Do you have any history of heart disease?" (e.g., heart attack, congestive heart failure)      High blood pressure 8. LUNG HISTORY: "Do you have any history of lung disease?"  (e.g., pulmonary embolus, asthma, emphysema)     no 9. PE RISK FACTORS: "Do you have a history of blood clots?" (or: recent major surgery, recent prolonged travel, bedridden)     None in history 10. OTHER SYMPTOMS: "Do you have any other symptoms?" (e.g., runny nose, wheezing, chest pain)       Slight congested nose 11. PREGNANCY:  "Is there any chance you are pregnant?" "When was your last menstrual period?"       na 12. TRAVEL: "Have you traveled out of the country in the last month?" (e.g., travel history, exposures)       Positive Covid  Protocols used: COUGH - ACUTE NON-PRODUCTIVE-A-AH

## 2020-03-06 NOTE — Telephone Encounter (Signed)
Schedule virtual visit 

## 2020-03-07 ENCOUNTER — Telehealth: Payer: BC Managed Care – PPO | Admitting: Adult Health

## 2020-03-12 ENCOUNTER — Telehealth (INDEPENDENT_AMBULATORY_CARE_PROVIDER_SITE_OTHER): Payer: BC Managed Care – PPO | Admitting: Family Medicine

## 2020-03-12 ENCOUNTER — Encounter: Payer: Self-pay | Admitting: Family Medicine

## 2020-03-12 ENCOUNTER — Other Ambulatory Visit: Payer: Self-pay

## 2020-03-12 DIAGNOSIS — U071 COVID-19: Secondary | ICD-10-CM

## 2020-03-12 DIAGNOSIS — J988 Other specified respiratory disorders: Secondary | ICD-10-CM

## 2020-03-12 MED ORDER — PREDNISONE 10 MG PO TABS: ORAL_TABLET | ORAL | 0 refills | Status: DC

## 2020-03-12 MED ORDER — AZITHROMYCIN 250 MG PO TABS: ORAL_TABLET | ORAL | 0 refills | Status: DC

## 2020-03-12 MED ORDER — ALBUTEROL SULFATE HFA 108 (90 BASE) MCG/ACT IN AERS: 2.0000 | INHALATION_SPRAY | Freq: Four times a day (QID) | RESPIRATORY_TRACT | 2 refills | Status: DC | PRN

## 2020-03-12 NOTE — Progress Notes (Signed)
MyChart Video Visit    Virtual Visit via Video Note   This visit type was conducted due to national recommendations for restrictions regarding the COVID-19 Pandemic (e.g. social distancing) in an effort to limit this patient's exposure and mitigate transmission in our community. This patient is at least at moderate risk for complications without adequate follow up. This format is felt to be most appropriate for this patient at this time. Physical exam was limited by quality of the video and audio technology used for the visit.   Patient location: home Provider location: Office  I discussed the limitations of evaluation and management by telemedicine and the availability of in person appointments. The patient expressed understanding and agreed to proceed.  Patient: Jerome Graves   DOB: 02/13/1966   55 y.o. Male  MRN: [id number] Visit Date: 03/12/2020  Today's healthcare provider: Dortha Kern, PA-C   No chief complaint on file.  Subjective    HPI   Patient is a 55 year old male who presents via video visit for continued symptoms from Covid virus.  Patient began having symptoms on 02/29/20, tested positive on 03/01/20 and reports continued severe cough.  States he has been using over the counter cough syrup without relief.  He reports the cough is non productive.   Medications: Outpatient Medications Prior to Visit  Medication Sig  . chlorthalidone (HYGROTON) 25 MG tablet TAKE 1 TABLET(25 MG) BY MOUTH DAILY  . losartan (COZAAR) 100 MG tablet TAKE 1 TABLET(100 MG) BY MOUTH DAILY  . valACYclovir (VALTREX) 1000 MG tablet Take 1 tablet (1,000 mg total) by mouth 2 (two) times daily.   No facility-administered medications prior to visit.    Review of Systems  Constitutional: Positive for fatigue. Negative for chills, diaphoresis and fever.       Loss of taste and smell  HENT: Positive for congestion, ear discharge, ear pain, sinus pressure, sinus pain and tinnitus. Negative  for facial swelling, postnasal drip, rhinorrhea, sneezing and sore throat.   Respiratory: Positive for cough (non productive), shortness of breath and wheezing. Negative for chest tightness.   Cardiovascular: Negative for leg swelling.  Gastrointestinal: Positive for abdominal pain, diarrhea, nausea and vomiting.  Musculoskeletal: Negative for myalgias.  Neurological: Positive for headaches. Negative for dizziness.      Objective    There were no vitals taken for this visit.   Physical Exam: WDWN male in no apparent distress. Appears slightly pale. Head: Normocephalic, atraumatic. Neck: Supple, NROM Respiratory: No apparent distress. Some mild dyspnea Psych: Normal mood and affect      Assessment & Plan     1. Respiratory tract infection due to COVID-19 virus Developed a ticklish cough, loss of taste, frontal headache, PND and some GI upset over the past 10-11 days (03-02-20). Highest temperature was 101 last week and pulse oximetry down to 84-87%. Feeling very fatigued. Start Azithromycin with Prednisone taper and Albuterol Inhaler prn. Increase fluid intake and maintain quarantine at home. Continue COVID restrictions. May need to go to ER if dyspnea and pulse oximetry worsens or persists.  - azithromycin (ZITHROMAX) 250 MG tablet; Take 2 tablets by mouth today, then take 1 daily for 4 days.  Dispense: 6 tablet; Refill: 0 - predniSONE (DELTASONE) 10 MG tablet; Taper down by 1 tablet by mouth starting at 6 day 1, then, 5 day 2, 4 day 3, 3 day 4, 2 day 5 and 1 day 6. Divide dosage among meals and bedtime each day.  Dispense: 21 tablet;  Refill: 0 - albuterol (VENTOLIN HFA) 108 (90 Base) MCG/ACT inhaler; Inhale 2 puffs into the lungs every 6 (six) hours as needed for wheezing or shortness of breath.  Dispense: 8 g; Refill: 2   No follow-ups on file.     I discussed the assessment and treatment plan with the patient. The patient was provided an opportunity to ask questions and all were  answered. The patient agreed with the plan and demonstrated an understanding of the instructions.   The patient was advised to call back or seek an in-person evaluation if the symptoms worsen or if the condition fails to improve as anticipated.  I provided 20 minutes of non-face-to-face time during this encounter.  I, Janeah Kovacich, PA-C, have reviewed all documentation for this visit. The documentation on 03/12/20 for the exam, diagnosis, procedures, and orders are all accurate and complete.   Dortha Kern, PA-C [employer] [phone] (phone) [phone] (fax)  [facility]

## 2021-01-20 ENCOUNTER — Telehealth: Payer: BC Managed Care – PPO | Admitting: Physician Assistant

## 2021-01-20 DIAGNOSIS — J019 Acute sinusitis, unspecified: Secondary | ICD-10-CM

## 2021-01-20 DIAGNOSIS — B9689 Other specified bacterial agents as the cause of diseases classified elsewhere: Secondary | ICD-10-CM | POA: Diagnosis not present

## 2021-01-20 MED ORDER — AMOXICILLIN-POT CLAVULANATE 875-125 MG PO TABS: 1.0000 | ORAL_TABLET | Freq: Two times a day (BID) | ORAL | 0 refills | Status: DC

## 2021-01-20 NOTE — Progress Notes (Signed)

## 2021-04-26 ENCOUNTER — Emergency Department
Admission: EM | Admit: 2021-04-26 | Discharge: 2021-04-26 | Disposition: A | Discharge status: Home / Self Care | Payer: BC Managed Care – PPO | Attending: Emergency Medicine | Admitting: Emergency Medicine

## 2021-04-26 ENCOUNTER — Other Ambulatory Visit: Payer: Self-pay

## 2021-04-26 ENCOUNTER — Emergency Department: Payer: BC Managed Care – PPO

## 2021-04-26 DIAGNOSIS — R000 Tachycardia, unspecified: Secondary | ICD-10-CM | POA: Diagnosis not present

## 2021-04-26 DIAGNOSIS — K8020 Calculus of gallbladder without cholecystitis without obstruction: Secondary | ICD-10-CM | POA: Diagnosis not present

## 2021-04-26 DIAGNOSIS — R109 Unspecified abdominal pain: Secondary | ICD-10-CM | POA: Diagnosis not present

## 2021-04-26 DIAGNOSIS — E119 Type 2 diabetes mellitus without complications: Secondary | ICD-10-CM | POA: Diagnosis not present

## 2021-04-26 DIAGNOSIS — R739 Hyperglycemia, unspecified: Secondary | ICD-10-CM | POA: Diagnosis not present

## 2021-04-26 DIAGNOSIS — N132 Hydronephrosis with renal and ureteral calculous obstruction: Secondary | ICD-10-CM | POA: Diagnosis not present

## 2021-04-26 DIAGNOSIS — N201 Calculus of ureter: Secondary | ICD-10-CM | POA: Insufficient documentation

## 2021-04-26 LAB — CBC WITH DIFFERENTIAL/PLATELET
Abs Immature Granulocytes: 0.04 10*3/uL (ref 0.00–0.07)
Basophils Absolute: 0 10*3/uL (ref 0.0–0.1)
Basophils Relative: 0 %
Eosinophils Absolute: 0 10*3/uL (ref 0.0–0.5)
Eosinophils Relative: 0 %
HCT: 44.6 % (ref 39.0–52.0)
Hemoglobin: 15.7 g/dL (ref 13.0–17.0)
Immature Granulocytes: 0 %
Lymphocytes Relative: 7 %
Lymphs Abs: 0.6 10*3/uL — ABNORMAL LOW (ref 0.7–4.0)
MCH: 30.1 pg (ref 26.0–34.0)
MCHC: 35.2 g/dL (ref 30.0–36.0)
MCV: 85.6 fL (ref 80.0–100.0)
Monocytes Absolute: 0.5 10*3/uL (ref 0.1–1.0)
Monocytes Relative: 5 %
Neutro Abs: 7.8 10*3/uL — ABNORMAL HIGH (ref 1.7–7.7)
Neutrophils Relative %: 88 %
Platelets: 160 10*3/uL (ref 150–400)
RBC: 5.21 MIL/uL (ref 4.22–5.81)
RDW: 12.2 % (ref 11.5–15.5)
WBC: 9 10*3/uL (ref 4.0–10.5)
nRBC: 0 % (ref 0.0–0.2)

## 2021-04-26 LAB — BASIC METABOLIC PANEL
Anion gap: 18 — ABNORMAL HIGH (ref 5–15)
BUN: 15 mg/dL (ref 6–20)
CO2: 19 mmol/L — ABNORMAL LOW (ref 22–32)
Calcium: 8.7 mg/dL — ABNORMAL LOW (ref 8.9–10.3)
Chloride: 98 mmol/L (ref 98–111)
Creatinine, Ser: 1.17 mg/dL (ref 0.61–1.24)
GFR, Estimated: >60 mL/min (ref >60)
Glucose, Bld: 375 mg/dL — ABNORMAL HIGH (ref 70–99)
Potassium: 3.1 mmol/L — ABNORMAL LOW (ref 3.5–5.1)
Sodium: 135 mmol/L (ref 135–145)

## 2021-04-26 LAB — CBG MONITORING, ED: Glucose-Capillary: 289 mg/dL — ABNORMAL HIGH (ref 70–99)

## 2021-04-26 MED ORDER — ONDANSETRON 8 MG PO TBDP: 8.0000 mg | ORAL_TABLET | Freq: Three times a day (TID) | ORAL | 0 refills | Status: DC | PRN

## 2021-04-26 MED ORDER — IBUPROFEN 600 MG PO TABS: 600.0000 mg | ORAL_TABLET | Freq: Four times a day (QID) | ORAL | 0 refills | Status: DC | PRN

## 2021-04-26 MED ORDER — OXYCODONE-ACETAMINOPHEN 5-325 MG PO TABS: 1.0000 | ORAL_TABLET | ORAL | 0 refills | Status: AC | PRN

## 2021-04-26 MED ORDER — MORPHINE SULFATE (PF) 4 MG/ML IV SOLN
INTRAVENOUS | Status: AC
  Administered 2021-04-26: 4 mg via INTRAVENOUS
  Filled 2021-04-26: qty 1

## 2021-04-26 MED ORDER — TAMSULOSIN HCL 0.4 MG PO CAPS: 0.4000 mg | ORAL_CAPSULE | Freq: Every day | ORAL | 0 refills | Status: DC

## 2021-04-26 MED ORDER — SODIUM CHLORIDE 0.9 % IV BOLUS
1000.0000 mL | Freq: Once | INTRAVENOUS | Status: AC
  Administered 2021-04-26: 1000 mL via INTRAVENOUS

## 2021-04-26 MED ORDER — KETOROLAC TROMETHAMINE 30 MG/ML IJ SOLN: 30.0000 mg | Freq: Once | INTRAMUSCULAR | Status: AC

## 2021-04-26 MED ORDER — KETOROLAC TROMETHAMINE 30 MG/ML IJ SOLN
INTRAMUSCULAR | Status: AC
  Administered 2021-04-26: 30 mg via INTRAVENOUS
  Filled 2021-04-26: qty 1

## 2021-04-26 MED ORDER — MORPHINE SULFATE (PF) 4 MG/ML IV SOLN: 4.0000 mg | Freq: Once | INTRAVENOUS | Status: AC

## 2021-04-26 MED ORDER — ONDANSETRON HCL 4 MG/2ML IJ SOLN
4.0000 mg | Freq: Once | INTRAMUSCULAR | Status: AC
  Administered 2021-04-26: 4 mg via INTRAVENOUS
  Filled 2021-04-26: qty 2

## 2021-04-26 NOTE — ED Notes (Signed)
Patient transported to CT 

## 2021-04-26 NOTE — ED Provider Notes (Signed)
[facility] Provider Note    Event Date/Time   First MD Initiated Contact with Patient 04/26/21 [phone]     (approximate)   History   Flank Pain   HPI  Jerome Graves is a 56 y.o. male with prior history of kidney stones who presents with left flank pain intermittently for the last  week, but more intense since last night.  It is associated with nausea and vomiting.  He has had intermittent blood in his urine but not tonight.  He states the pain feels identical to prior kidney stones.  He states that he passed 2 last week before this pain started.      Physical Exam   Triage Vital Signs: ED Triage Vitals  Enc Vitals Group     BP 04/26/21 0457 (!) 192/119     Pulse Rate 04/26/21 0457 (!) 102     Resp 04/26/21 0456 (!) 22     Temp 04/26/21 0456 98.2 F (36.8 C)     Temp Source 04/26/21 0456 Oral     SpO2 04/26/21 0457 94 %     Weight 04/26/21 0454 245 lb (111.1 kg)     Height 04/26/21 0454 6' (1.829 m)     Head Circumference --      Peak Flow --      Pain Score 04/26/21 0454 10     Pain Loc --      Pain Edu? --      Excl. in [place]? --     Most recent vital signs: Vitals:   04/26/21 0457 04/26/21 0650  BP: (!) 192/119 (!) 171/102  Pulse: (!) 102 98  Resp: 20   Temp: 98.2 F (36.8 C) 98 F (36.7 C)  SpO2: 94% 97%     General: Alert, uncomfortable appearing but in no acute distress. CV:  Good peripheral perfusion.  Resp:  Normal effort.  Abd:  No distention.  Soft and nontender. Other:  Left CVA tenderness.   ED Results / Procedures / Treatments   Labs (all labs ordered are listed, but only abnormal results are displayed) Labs Reviewed  BASIC METABOLIC PANEL - Abnormal; Notable for the following components:      Result Value   Potassium 3.1 (*)    CO2 19 (*)    Glucose, Bld 375 (*)    Calcium 8.7 (*)    Anion gap 18 (*)    All other components within normal limits  CBC WITH DIFFERENTIAL/PLATELET - Abnormal; Notable for the  following components:   Neutro Abs 7.8 (*)    Lymphs Abs 0.6 (*)    All other components within normal limits  URINALYSIS, ROUTINE W REFLEX MICROSCOPIC  CBG MONITORING, ED     EKG     RADIOLOGY  CT abdomen/pelvis: I independently viewed and interpreted the images; there is a large left ureteral stone with hydronephrosis.  Radiology impression is as follows:  IMPRESSION:  1. Obstructing 9 x 5 mm stone in the left mid ureter.  2. Punctate left lower pole renal calculus.  3. Cholelithiasis.    PROCEDURES:  Critical Care performed: No  Procedures   MEDICATIONS ORDERED IN ED: Medications  sodium chloride 0.9 % bolus 1,000 mL (0 mLs Intravenous Stopped 04/26/21 0717)  ketorolac (TORADOL) 30 MG/ML injection 30 mg (30 mg Intravenous Given 04/26/21 0606)  morphine (PF) 4 MG/ML injection 4 mg (4 mg Intravenous Given 04/26/21 0605)  ondansetron (ZOFRAN) injection 4 mg (4 mg Intravenous Given 04/26/21 0603)  IMPRESSION / MDM / ASSESSMENT AND PLAN / ED COURSE  I reviewed the triage vital signs and the nursing notes.  56 year old male with PMH as noted above including a prior history of kidney stones presents with left flank pain over the last week, acutely worsened since last night.    On exam he is uncomfortable appearing but in no acute distress.  He is hypertensive and tachycardic consistent with acute pain.  He has left CVA tenderness.  Overall presentation is most consistent with ureteral stone although differential also includes less likely pyelonephritis, musculoskeletal pain, diverticulitis, colitis.  I reviewed the past medical records.  The patient most recently had a CT abdomen for ureteral stone in 2012.  I do not see any recent urology or ED visits.  We will give fluids and analgesia and obtain a CT to evaluate for obstructive stone.  ----------------------------------------- 7:46 AM on 04/26/2021 -----------------------------------------  CT shows a large left  ureteral stone with hydronephrosis.  On the labs, the creatinine is normal.  The patient has a slightly elevated anion gap and is hyperglycemic although this is consistent with his recent nausea and vomiting.  There is no clinical evidence for DKA.  CBC is normal.  On reassessment, the patient's pain is well controlled with the medications he has received.  I consulted Dr. Erlene Quan from urology who advises that if the patient's pain is well controlled he can be discharged home with a plan for close outpatient follow-up for likely procedural intervention.  Her office will contact the patient this week.  At this time, the patient feels comfortable to go home.  I counseled him on the results of the work-up and the follow-up plan.  I gave him very thorough return precautions and he expressed understanding.   FINAL CLINICAL IMPRESSION(S) / ED DIAGNOSES   Final diagnoses:  Ureteral stone     Rx / DC Orders   ED Discharge Orders          Ordered    ibuprofen (ADVIL) 600 MG tablet  Every 6 hours PRN        04/26/21 0744    oxyCODONE-acetaminophen (PERCOCET) 5-325 MG tablet  Every 4 hours PRN        04/26/21 0744    tamsulosin (FLOMAX) 0.4 MG CAPS capsule  Daily        04/26/21 0744    ondansetron (ZOFRAN-ODT) 8 MG disintegrating tablet  Every 8 hours PRN        04/26/21 0744             Note:  This document was prepared using Dragon voice recognition software and may include unintentional dictation errors.    Arta Silence, MD 04/26/21 [phone]

## 2021-04-26 NOTE — Discharge Instructions (Addendum)
Take the ibuprofen every 6 hours, and the Percocet on top of this if needed for breakthrough pain.  Take the Flomax daily to help the chance of passing the stone, and you may take the Zofran as needed for nausea.  Return to the ER for new, worsening, or persistent severe pain, fever, vomiting, or any other new or worsening symptoms that concern you.  Dr. Delana Meyer office should reach out to you early in the week to arrange for an outpatient treatment, however if you do not hear from her office you can call the number above.

## 2021-04-26 NOTE — ED Triage Notes (Addendum)
Spouse doing speaking for pt states pt with left flank pain for a day with vomiting. Per spouse pt with a history of renal calculi.

## 2021-04-29 ENCOUNTER — Other Ambulatory Visit: Payer: Self-pay

## 2021-04-29 ENCOUNTER — Ambulatory Visit (INDEPENDENT_AMBULATORY_CARE_PROVIDER_SITE_OTHER): Payer: BC Managed Care – PPO | Admitting: Urology

## 2021-04-29 ENCOUNTER — Ambulatory Visit
Admission: RE | Admit: 2021-04-29 | Discharge: 2021-04-29 | Disposition: A | Discharge status: Home / Self Care | Payer: BC Managed Care – PPO | Source: Ambulatory Visit | Attending: Urology | Admitting: Urology

## 2021-04-29 ENCOUNTER — Telehealth: Payer: Self-pay

## 2021-04-29 ENCOUNTER — Ambulatory Visit
Admission: RE | Admit: 2021-04-29 | Discharge: 2021-04-29 | Disposition: A | Discharge status: Home / Self Care | Payer: BC Managed Care – PPO | Attending: Urology | Admitting: Urology

## 2021-04-29 VITALS — BP 195/132 | HR 105 | Ht 72.0 in | Wt 252.0 lb

## 2021-04-29 DIAGNOSIS — N200 Calculus of kidney: Secondary | ICD-10-CM

## 2021-04-29 DIAGNOSIS — Z87442 Personal history of urinary calculi: Secondary | ICD-10-CM | POA: Diagnosis not present

## 2021-04-29 DIAGNOSIS — I878 Other specified disorders of veins: Secondary | ICD-10-CM | POA: Diagnosis not present

## 2021-04-29 DIAGNOSIS — N201 Calculus of ureter: Secondary | ICD-10-CM

## 2021-04-29 DIAGNOSIS — K8020 Calculus of gallbladder without cholecystitis without obstruction: Secondary | ICD-10-CM | POA: Diagnosis not present

## 2021-04-29 LAB — MICROSCOPIC EXAMINATION
Bacteria, UA: NONE SEEN
Epithelial Cells (non renal): NONE SEEN /hpf (ref 0–10)

## 2021-04-29 LAB — URINALYSIS, COMPLETE
Bilirubin, UA: NEGATIVE
Leukocytes,UA: NEGATIVE
Nitrite, UA: NEGATIVE
Specific Gravity, UA: 1.025 (ref 1.005–1.030)
Urobilinogen, Ur: 0.2 mg/dL (ref 0.2–1.0)
pH, UA: 6 (ref 5.0–7.5)

## 2021-04-29 NOTE — Addendum Note (Signed)
Addended by: Gerald Leitz A on: 04/29/2021 12:02 PM   Modules accepted: Orders

## 2021-04-29 NOTE — Progress Notes (Signed)
[facility] ORDER FORM  Expected date of procedure: 05/01/2021  Surgeon: Nickolas Madrid, MD  Post op standing: 2-4wk follow up w/KUB prior  Anticoagulation/Aspirin/NSAID standing order: Hold all 72 hours prior  Anesthesia standing order: MAC  VTE standing: SCD's  Dx: Left Ureteral Stone  Procedure: left Extracorporeal shock wave lithotripsy  CPT : 10258  Standing Order Set:   *NPO after mn, KUB  *NS 111m/hr, Keflex 5062mPO, Benadryl 2522mO, Valium 67m39m, Zofran 4mg 48m   Medications if other than standing orders:   NONE

## 2021-04-29 NOTE — Telephone Encounter (Signed)
Note sent to patient regarding follow up.

## 2021-04-29 NOTE — Progress Notes (Signed)
04/29/21 8:32 AM   Jerome Graves 16-Jun-1965 [id number]  Referring provider:  No referring provider defined for this encounter. Chief Complaint  Patient presents with   Nephrolithiasis     HPI: Jerome Graves is a 56 y.o.male for further evaluation of ureteral stone.  Jerome Graves began experiencing left flank pain for about a week which intensified which progressed to nausea and vomiting resulting in an ER visit on 02/23/2022.  Work-up in the ER included CT renal stone study visualized Obstructing 9 x 5 mm stone in the left mid ureter.  Creatinine 1.17.  He had no leukocytosis.  No urinalysis was performed.  Once his pain was able to be controlled, he was discharged with outpatient urologic follow-up.  CT was personally.   reviewed today and shows Hounsfield units 450 and stone to skin distance 16-17 cm  He had urinated blood and he is still experiencing pain. He is not experiencing burning but aching with urination. He has been having stones since he was in his 55s. He had a ureteroscopy in 2011.  He did not tolerate the stent well.  He reports that he took ibuprofen about and hour ago (7 am).He was prescribed ibuprofen by ER but he is not on any other blood thinners.    PMH: Past Medical History:  Diagnosis Date   GERD (gastroesophageal reflux disease)    Gross hematuria    History of kidney stones    Hypertension    Renal calculus, right    Right flank pain     Surgical History: Past Surgical History:  Procedure Laterality Date   CYSTOSCOPY WITH RETROGRADE PYELOGRAM, URETEROSCOPY AND STENT PLACEMENT Bilateral 09/13/2013   Procedure: CYSTOSCOPY WITH BILATERAL RETROGRADE PYELOGRAM, RIGHT URETEROSCOPY AND STENT PLACEMENT;  Surgeon: Sebastian Ache, MD;  [facility];  Service: Urology;  Laterality: Bilateral;   HOLMIUM LASER APPLICATION Right 09/13/2013   Procedure: HOLMIUM LASER APPLICATION;  Surgeon: Sebastian Ache, MD;  Location: [facility];  Service: Urology;  Laterality: Right;   NEGATIVE SLEEP STUDY     2003 -- PER PT   PERCUTANEOUS NEPHROLITHOTRIPSY Left 2010   PINNING RIGHT RING FINGER FX  YRS AGO    Home Medications:  Allergies as of 04/29/2021   No Known Allergies      Medication List        Accurate as of April 29, 2021  8:32 AM. If you have any questions, ask your nurse or doctor.          albuterol 108 (90 Base) MCG/ACT inhaler Commonly known as: VENTOLIN HFA Inhale 2 puffs into the lungs every 6 (six) hours as needed for wheezing or shortness of breath.   amoxicillin-clavulanate 875-125 MG tablet Commonly known as: AUGMENTIN Take 1 tablet by mouth 2 (two) times daily.   chlorthalidone 25 MG tablet Commonly known as: HYGROTON TAKE 1 TABLET(25 MG) BY MOUTH DAILY   ibuprofen 600 MG tablet Commonly known as: ADVIL Take 1 tablet (600 mg total) by mouth every 6 (six) hours as needed.   losartan 100 MG tablet Commonly known as: COZAAR TAKE 1 TABLET(100 MG) BY MOUTH DAILY   ondansetron 8 MG disintegrating tablet Commonly known as: ZOFRAN-ODT Take 1 tablet (8 mg total) by mouth every 8 (eight) hours as needed for nausea or vomiting.   oxyCODONE-acetaminophen 5-325 MG tablet Commonly known as: Percocet Take 1 tablet by mouth every 4 (four) hours as needed for up to 5 days for severe pain.  tamsulosin 0.4 MG Caps capsule Commonly known as: Flomax Take 1 capsule (0.4 mg total) by mouth daily for 7 days.   valACYclovir 1000 MG tablet Commonly known as: VALTREX Take 1 tablet (1,000 mg total) by mouth 2 (two) times daily.        Allergies: No Known Allergies  Family History: Family History  Problem Relation Age of Onset   Cancer Father     Social History:  reports that he has never smoked. He has never used smokeless tobacco. He reports current alcohol use of about 2.0 standard drinks per week. He reports that he does not use drugs.   Physical Exam: BP (!) 195/132     Pulse (!) 105    Ht 6' (1.829 m)    Wt 252 lb (114.3 kg)    BMI 34.18 kg/m   Constitutional:  Alert and oriented, No acute distress. HEENT: [place] AT, moist mucus membranes.  Trachea midline, no masses. Cardiovascular: No clubbing, cyanosis, or edema. Respiratory: Normal respiratory effort, no increased work of breathing. Skin: No rashes, bruises or suspicious lesions. Neurologic: Grossly intact, no focal deficits, moving all 4 extremities. Psychiatric: Normal mood and affect.  Laboratory Data:  Lab Results  Component Value Date   CREATININE 1.17 04/26/2021     Pertinent Imaging: CLINICAL DATA:  Flank pain with kidney stone suspected   EXAM: CT ABDOMEN AND PELVIS WITHOUT CONTRAST   TECHNIQUE: Multidetector CT imaging of the abdomen and pelvis was performed following the standard protocol without IV contrast.   RADIATION DOSE REDUCTION: This exam was performed according to the departmental dose-optimization program which includes automated exposure control, adjustment of the mA and/or kV according to patient size and/or use of iterative reconstruction technique.   COMPARISON:  08/08/2013   FINDINGS: Lower chest:  Mild scarring or atelectasis at the bases.   Hepatobiliary: No focal liver abnormality.Cholecystectomy. Cholelithiasis. No evidence of biliary inflammation.   Pancreas: Unremarkable.   Spleen: Unremarkable.   Adrenals/Urinary Tract: Negative adrenals. Left hydronephrosis and upper hydroureter due to a 9 x 5 mm ureteral stone projecting at the level of L4-5. associated left low-density renal expansion and perinephric stranding. Punctate left lower pole calculus. Unremarkable bladder.   Stomach/Bowel:  No obstruction. No appendicitis.   Vascular/Lymphatic: No acute vascular abnormality. No mass or adenopathy.   Reproductive:Vas deferens calcifications usually associated with diabetes.   Other: No ascites or pneumoperitoneum.   Musculoskeletal: No acute  abnormalities.  L5 chronic pars defects.   IMPRESSION: 1. Obstructing 9 x 5 mm stone in the left mid ureter. 2. Punctate left lower pole renal calculus. 3. Cholelithiasis.     Electronically Signed   By: Jorje Guild M.D.   On: 04/26/2021 06:25   I have personally reviewed the images and agree with radiologist interpretation.    Assessment & Plan:    1.  Left ureteral calculus Given the size of the stone, unlikely to pass spontaneously  We discussed various treatment options for urolithiasis including observation with or without medical expulsive therapy, shockwave lithotripsy (SWL), ureteroscopy and laser lithotripsy with stent placement, and percutaneous nephrolithotomy.   We discussed that management is based on stone size, location, density, patient co-morbidities, and patient preference.    SWL has a lower stone free rate in a single procedure, but also a lower complication rate compared to ureteroscopy and avoids a stent and associated stent related symptoms. Possible complications include renal hematoma, steinstrasse, and need for additional treatment. We discussed the role of his increased skin  to stone distance can lead to decreased efficacy with shockwave lithotripsy.   Ureteroscopy with laser lithotripsy and stent placement has a higher stone free rate than SWL in a single procedure, however increased complication rate including possible infection, ureteral injury, bleeding, and stent related morbidity. Common stent related symptoms include dysuria, urgency/frequency, and flank pain.   After an extensive discussion of the risks and benefits of the above treatment options, the patient would like to proceed with ESWL  - Discussed with him that his stone is not particularly dense but in a challenging position for ESWL, KUB to assess  - We discussed how he should cease taking blood thinners such as ibuprofen until after his procedure  -- Discussed with patient that if  they expereince symptoms such as severe pain , nausea and vomiting in the interim  advise to go to the emergency room   - Urine sent for pre-op culture   Return for ESWL  [facility] [address] [place], [place] [address] [phone]  I have reviewed the above documentation for accuracy and completeness, and I agree with the above.   Hollice Espy, MD

## 2021-04-29 NOTE — H&P (View-Only) (Signed)
° °04/29/21 °8:32 AM  ° °Jerome Graves °07/12/1965 °[id number] ° °Referring provider:  °No referring provider defined for this encounter. °Chief Complaint  °Patient presents with  ° Nephrolithiasis  ° ° ° °HPI: °Jerome Graves is a 55 y.o.male for further evaluation of ureteral stone. ° °Jerome Graves began experiencing left flank pain for about a week which intensified which progressed to nausea and vomiting resulting in an ER visit on 02/23/2022.  Work-up in the ER included CT renal stone study visualized Obstructing 9 x 5 mm stone in the left mid ureter.  Creatinine 1.17.  He had no leukocytosis.  No urinalysis was performed.  Once his pain was able to be controlled, he was discharged with outpatient urologic follow-up. ° °CT was personally.   reviewed today and shows Hounsfield units 450 and stone to skin distance 16-17 cm ° °He had urinated blood and he is still experiencing pain. He is not experiencing burning but aching with urination. He has been having stones since he was in his 20s. He had a ureteroscopy in 2011.  He did not tolerate the stent well. ° °He reports that he took ibuprofen about and hour ago (7 am).He was prescribed ibuprofen by ER but he is not on any other blood thinners.  ° ° °PMH: °Past Medical History:  °Diagnosis Date  ° GERD (gastroesophageal reflux disease)   ° Gross hematuria   ° History of kidney stones   ° Hypertension   ° Renal calculus, right   ° Right flank pain   ° ° °Surgical History: °Past Surgical History:  °Procedure Laterality Date  ° CYSTOSCOPY WITH RETROGRADE PYELOGRAM, URETEROSCOPY AND STENT PLACEMENT Bilateral 09/13/2013  ° Procedure: CYSTOSCOPY WITH BILATERAL RETROGRADE PYELOGRAM, RIGHT URETEROSCOPY AND STENT PLACEMENT;  Surgeon: Theodore Manny, MD;  Location: [facility];  Service: Urology;  Laterality: Bilateral;  ° HOLMIUM LASER APPLICATION Right 09/13/2013  ° Procedure: HOLMIUM LASER APPLICATION;  Surgeon: Theodore Manny, MD;  Location: [facility];  Service: Urology;  Laterality: Right;  ° NEGATIVE SLEEP STUDY    ° 2003 -- PER PT  ° PERCUTANEOUS NEPHROLITHOTRIPSY Left 2010  ° PINNING RIGHT RING FINGER FX  YRS AGO  ° ° °Home Medications:  °Allergies as of 04/29/2021   °No Known Allergies °  ° °  °Medication List  °  ° °  ° Accurate as of April 29, 2021  8:32 AM. If you have any questions, ask your nurse or doctor.  °  °  ° °  ° °albuterol 108 (90 Base) MCG/ACT inhaler °Commonly known as: VENTOLIN HFA °Inhale 2 puffs into the lungs every 6 (six) hours as needed for wheezing or shortness of breath. °  °amoxicillin-clavulanate 875-125 MG tablet °Commonly known as: AUGMENTIN °Take 1 tablet by mouth 2 (two) times daily. °  °chlorthalidone 25 MG tablet °Commonly known as: HYGROTON °TAKE 1 TABLET(25 MG) BY MOUTH DAILY °  °ibuprofen 600 MG tablet °Commonly known as: ADVIL °Take 1 tablet (600 mg total) by mouth every 6 (six) hours as needed. °  °losartan 100 MG tablet °Commonly known as: COZAAR °TAKE 1 TABLET(100 MG) BY MOUTH DAILY °  °ondansetron 8 MG disintegrating tablet °Commonly known as: ZOFRAN-ODT °Take 1 tablet (8 mg total) by mouth every 8 (eight) hours as needed for nausea or vomiting. °  °oxyCODONE-acetaminophen 5-325 MG tablet °Commonly known as: Percocet °Take 1 tablet by mouth every 4 (four) hours as needed for up to 5 days for severe pain. °  °  tamsulosin 0.4 MG Caps capsule Commonly known as: Flomax Take 1 capsule (0.4 mg total) by mouth daily for 7 days.   valACYclovir 1000 MG tablet Commonly known as: VALTREX Take 1 tablet (1,000 mg total) by mouth 2 (two) times daily.        Allergies: No Known Allergies  Family History: Family History  Problem Relation Age of Onset   Cancer Father     Social History:  reports that he has never smoked. He has never used smokeless tobacco. He reports current alcohol use of about 2.0 standard drinks per week. He reports that he does not use drugs.   Physical Exam: BP (!) 195/132     Pulse (!) 105    Ht 6' (1.829 m)    Wt 252 lb (114.3 kg)    BMI 34.18 kg/m   Constitutional:  Alert and oriented, No acute distress. HEENT: [place] AT, moist mucus membranes.  Trachea midline, no masses. Cardiovascular: No clubbing, cyanosis, or edema. Respiratory: Normal respiratory effort, no increased work of breathing. Skin: No rashes, bruises or suspicious lesions. Neurologic: Grossly intact, no focal deficits, moving all 4 extremities. Psychiatric: Normal mood and affect.  Laboratory Data:  Lab Results  Component Value Date   CREATININE 1.17 04/26/2021     Pertinent Imaging: CLINICAL DATA:  Flank pain with kidney stone suspected   EXAM: CT ABDOMEN AND PELVIS WITHOUT CONTRAST   TECHNIQUE: Multidetector CT imaging of the abdomen and pelvis was performed following the standard protocol without IV contrast.   RADIATION DOSE REDUCTION: This exam was performed according to the departmental dose-optimization program which includes automated exposure control, adjustment of the mA and/or kV according to patient size and/or use of iterative reconstruction technique.   COMPARISON:  08/08/2013   FINDINGS: Lower chest:  Mild scarring or atelectasis at the bases.   Hepatobiliary: No focal liver abnormality.Cholecystectomy. Cholelithiasis. No evidence of biliary inflammation.   Pancreas: Unremarkable.   Spleen: Unremarkable.   Adrenals/Urinary Tract: Negative adrenals. Left hydronephrosis and upper hydroureter due to a 9 x 5 mm ureteral stone projecting at the level of L4-5. associated left low-density renal expansion and perinephric stranding. Punctate left lower pole calculus. Unremarkable bladder.   Stomach/Bowel:  No obstruction. No appendicitis.   Vascular/Lymphatic: No acute vascular abnormality. No mass or adenopathy.   Reproductive:Vas deferens calcifications usually associated with diabetes.   Other: No ascites or pneumoperitoneum.   Musculoskeletal: No acute  abnormalities.  L5 chronic pars defects.   IMPRESSION: 1. Obstructing 9 x 5 mm stone in the left mid ureter. 2. Punctate left lower pole renal calculus. 3. Cholelithiasis.     Electronically Signed   By: Jorje Guild M.D.   On: 04/26/2021 06:25   I have personally reviewed the images and agree with radiologist interpretation.    Assessment & Plan:    1.  Left ureteral calculus Given the size of the stone, unlikely to pass spontaneously  We discussed various treatment options for urolithiasis including observation with or without medical expulsive therapy, shockwave lithotripsy (SWL), ureteroscopy and laser lithotripsy with stent placement, and percutaneous nephrolithotomy.   We discussed that management is based on stone size, location, density, patient co-morbidities, and patient preference.    SWL has a lower stone free rate in a single procedure, but also a lower complication rate compared to ureteroscopy and avoids a stent and associated stent related symptoms. Possible complications include renal hematoma, steinstrasse, and need for additional treatment. We discussed the role of his increased skin  to stone distance can lead to decreased efficacy with shockwave lithotripsy.   Ureteroscopy with laser lithotripsy and stent placement has a higher stone free rate than SWL in a single procedure, however increased complication rate including possible infection, ureteral injury, bleeding, and stent related morbidity. Common stent related symptoms include dysuria, urgency/frequency, and flank pain.   After an extensive discussion of the risks and benefits of the above treatment options, the patient would like to proceed with ESWL  - Discussed with him that his stone is not particularly dense but in a challenging position for ESWL, KUB to assess  - We discussed how he should cease taking blood thinners such as ibuprofen until after his procedure  -- Discussed with patient that if  they expereince symptoms such as severe pain , nausea and vomiting in the interim  advise to go to the emergency room   - Urine sent for pre-op culture   Return for ESWL  [facility] [address], [place], [place] [address] [phone]  I have reviewed the above documentation for accuracy and completeness, and I agree with the above.   Hollice Espy, MD

## 2021-04-30 MED ORDER — ONDANSETRON HCL 4 MG/2ML IJ SOLN: 4.0000 mg | Freq: Once | INTRAMUSCULAR | Status: DC

## 2021-04-30 MED ORDER — SODIUM CHLORIDE 0.9 % IV SOLN: INTRAVENOUS | Status: DC

## 2021-04-30 MED ORDER — DIAZEPAM 5 MG PO TABS: 10.0000 mg | ORAL_TABLET | ORAL | Status: DC

## 2021-04-30 MED ORDER — DIPHENHYDRAMINE HCL 25 MG PO CAPS: 25.0000 mg | ORAL_CAPSULE | ORAL | Status: DC

## 2021-04-30 MED ORDER — CEPHALEXIN 500 MG PO CAPS: 500.0000 mg | ORAL_CAPSULE | Freq: Once | ORAL | Status: DC

## 2021-05-01 ENCOUNTER — Encounter
Admission: RE | Disposition: A | Discharge status: Home / Self Care | Payer: Self-pay | Source: Home / Self Care | Attending: Urology

## 2021-05-01 ENCOUNTER — Other Ambulatory Visit: Payer: Self-pay

## 2021-05-01 ENCOUNTER — Ambulatory Visit: Payer: BC Managed Care – PPO | Admitting: Anesthesiology

## 2021-05-01 ENCOUNTER — Ambulatory Visit
Admission: RE | Admit: 2021-05-01 | Discharge: 2021-05-01 | Disposition: A | Discharge status: Home / Self Care | Payer: BC Managed Care – PPO | Attending: Urology | Admitting: Urology

## 2021-05-01 ENCOUNTER — Ambulatory Visit: Payer: BC Managed Care – PPO

## 2021-05-01 ENCOUNTER — Encounter: Payer: Self-pay | Admitting: Urology

## 2021-05-01 ENCOUNTER — Ambulatory Visit: Admit: 2021-05-01 | Payer: BC Managed Care – PPO | Admitting: Urology

## 2021-05-01 DIAGNOSIS — K219 Gastro-esophageal reflux disease without esophagitis: Secondary | ICD-10-CM | POA: Insufficient documentation

## 2021-05-01 DIAGNOSIS — I878 Other specified disorders of veins: Secondary | ICD-10-CM | POA: Diagnosis not present

## 2021-05-01 DIAGNOSIS — I10 Essential (primary) hypertension: Secondary | ICD-10-CM | POA: Insufficient documentation

## 2021-05-01 DIAGNOSIS — Z01818 Encounter for other preprocedural examination: Secondary | ICD-10-CM | POA: Diagnosis not present

## 2021-05-01 DIAGNOSIS — N201 Calculus of ureter: Secondary | ICD-10-CM | POA: Diagnosis not present

## 2021-05-01 DIAGNOSIS — K8020 Calculus of gallbladder without cholecystitis without obstruction: Secondary | ICD-10-CM | POA: Diagnosis not present

## 2021-05-01 DIAGNOSIS — N132 Hydronephrosis with renal and ureteral calculous obstruction: Secondary | ICD-10-CM | POA: Insufficient documentation

## 2021-05-01 DIAGNOSIS — Z87442 Personal history of urinary calculi: Secondary | ICD-10-CM | POA: Diagnosis not present

## 2021-05-01 HISTORY — PX: CYSTOSCOPY W/ RETROGRADES: SHX1426

## 2021-05-01 HISTORY — PX: CYSTOSCOPY/URETEROSCOPY/HOLMIUM LASER/STENT PLACEMENT: SHX6546

## 2021-05-01 HISTORY — PX: EXTRACORPOREAL SHOCK WAVE LITHOTRIPSY: SHX1557

## 2021-05-01 SURGERY — CYSTOSCOPY/URETEROSCOPY/HOLMIUM LASER/STENT PLACEMENT
Anesthesia: General | Laterality: Left

## 2021-05-01 SURGERY — LITHOTRIPSY, ESWL
Anesthesia: Moderate Sedation | Laterality: Left

## 2021-05-01 MED ORDER — OXYCODONE-ACETAMINOPHEN 5-325 MG PO TABS
1.0000 | ORAL_TABLET | Freq: Four times a day (QID) | ORAL | 0 refills | Status: AC | PRN
Start: 2021-05-01 — End: 2021-05-04

## 2021-05-01 MED ORDER — PROPOFOL 10 MG/ML IV BOLUS
INTRAVENOUS | Status: AC
  Filled 2021-05-01: qty 20

## 2021-05-01 MED ORDER — PHENYLEPHRINE 40 MCG/ML (10ML) SYRINGE FOR IV PUSH (FOR BLOOD PRESSURE SUPPORT)
PREFILLED_SYRINGE | INTRAVENOUS | Status: DC | PRN
Start: 2021-05-01 — End: 2021-05-01
  Administered 2021-05-01 (×6): 80 ug via INTRAVENOUS

## 2021-05-01 MED ORDER — DEXAMETHASONE SODIUM PHOSPHATE 10 MG/ML IJ SOLN
INTRAMUSCULAR | Status: DC | PRN
  Administered 2021-05-01: 10 mg via INTRAVENOUS

## 2021-05-01 MED ORDER — TAMSULOSIN HCL 0.4 MG PO CAPS: 0.4000 mg | ORAL_CAPSULE | Freq: Every day | ORAL | 0 refills | Status: AC

## 2021-05-01 MED ORDER — PROPOFOL 10 MG/ML IV BOLUS
INTRAVENOUS | Status: DC | PRN
Start: 2021-05-01 — End: 2021-05-01
  Administered 2021-05-01: 200 mg via INTRAVENOUS

## 2021-05-01 MED ORDER — CHLORHEXIDINE GLUCONATE 0.12 % MT SOLN
OROMUCOSAL | Status: AC
  Administered 2021-05-01: 15 mL via OROMUCOSAL
  Filled 2021-05-01: qty 15

## 2021-05-01 MED ORDER — SUGAMMADEX SODIUM 200 MG/2ML IV SOLN
INTRAVENOUS | Status: DC | PRN
Start: 2021-05-01 — End: 2021-05-01
  Administered 2021-05-01: 200 mg via INTRAVENOUS

## 2021-05-01 MED ORDER — DIPHENHYDRAMINE HCL 25 MG PO CAPS
ORAL_CAPSULE | ORAL | Status: AC
  Filled 2021-05-01: qty 1

## 2021-05-01 MED ORDER — CEPHALEXIN 500 MG PO CAPS
ORAL_CAPSULE | ORAL | Status: AC
  Filled 2021-05-01: qty 1

## 2021-05-01 MED ORDER — ROCURONIUM BROMIDE 100 MG/10ML IV SOLN
INTRAVENOUS | Status: DC | PRN
  Administered 2021-05-01 (×2): 20 mg via INTRAVENOUS

## 2021-05-01 MED ORDER — MIDAZOLAM HCL 2 MG/2ML IJ SOLN
INTRAMUSCULAR | Status: DC | PRN
  Administered 2021-05-01 (×2): 1 mg via INTRAVENOUS

## 2021-05-01 MED ORDER — KETOROLAC TROMETHAMINE 30 MG/ML IJ SOLN
INTRAMUSCULAR | Status: DC | PRN
Start: 2021-05-01 — End: 2021-05-01
  Administered 2021-05-01: 15 mg via INTRAVENOUS

## 2021-05-01 MED ORDER — LIDOCAINE HCL (PF) 2 % IJ SOLN
INTRAMUSCULAR | Status: AC
  Filled 2021-05-01: qty 5

## 2021-05-01 MED ORDER — CEFAZOLIN SODIUM-DEXTROSE 2-4 GM/100ML-% IV SOLN
2.0000 g | Freq: Once | INTRAVENOUS | Status: AC
  Administered 2021-05-01: 2 g via INTRAVENOUS

## 2021-05-01 MED ORDER — DIAZEPAM 5 MG PO TABS
ORAL_TABLET | ORAL | Status: AC
  Filled 2021-05-01: qty 2

## 2021-05-01 MED ORDER — CEPHALEXIN 500 MG PO CAPS: 500.0000 mg | ORAL_CAPSULE | Freq: Every day | ORAL | 0 refills | Status: DC

## 2021-05-01 MED ORDER — ACETAMINOPHEN 10 MG/ML IV SOLN
INTRAVENOUS | Status: DC | PRN
Start: 2021-05-01 — End: 2021-05-01
  Administered 2021-05-01: 1000 mg via INTRAVENOUS

## 2021-05-01 MED ORDER — SODIUM CHLORIDE 0.9 % IR SOLN
Status: DC | PRN
  Administered 2021-05-01: 3000 mL

## 2021-05-01 MED ORDER — OXYCODONE HCL 5 MG PO TABS
ORAL_TABLET | ORAL | Status: AC
  Filled 2021-05-01: qty 1

## 2021-05-01 MED ORDER — CEFAZOLIN SODIUM-DEXTROSE 2-4 GM/100ML-% IV SOLN
INTRAVENOUS | Status: AC
  Filled 2021-05-01: qty 100

## 2021-05-01 MED ORDER — ONDANSETRON HCL 4 MG/2ML IJ SOLN
INTRAMUSCULAR | Status: AC
  Filled 2021-05-01: qty 2

## 2021-05-01 MED ORDER — MIDAZOLAM HCL 2 MG/2ML IJ SOLN
INTRAMUSCULAR | Status: AC
  Filled 2021-05-01: qty 2

## 2021-05-01 MED ORDER — ONDANSETRON HCL 4 MG/2ML IJ SOLN
INTRAMUSCULAR | Status: DC | PRN
  Administered 2021-05-01: 4 mg via INTRAVENOUS

## 2021-05-01 MED ORDER — KETOROLAC TROMETHAMINE 30 MG/ML IJ SOLN
INTRAMUSCULAR | Status: AC
  Filled 2021-05-01: qty 1

## 2021-05-01 MED ORDER — OXYCODONE HCL 5 MG PO TABS
5.0000 mg | ORAL_TABLET | Freq: Once | ORAL | Status: AC | PRN
  Administered 2021-05-01: 5 mg via ORAL

## 2021-05-01 MED ORDER — LACTATED RINGERS IV SOLN: INTRAVENOUS | Status: DC

## 2021-05-01 MED ORDER — IOHEXOL 180 MG/ML  SOLN
INTRAMUSCULAR | Status: DC | PRN
  Administered 2021-05-01: 6 mL

## 2021-05-01 MED ORDER — FENTANYL CITRATE (PF) 100 MCG/2ML IJ SOLN
INTRAMUSCULAR | Status: AC
Start: 2021-05-01 — End: ?
  Filled 2021-05-01: qty 2

## 2021-05-01 MED ORDER — FENTANYL CITRATE (PF) 100 MCG/2ML IJ SOLN
INTRAMUSCULAR | Status: DC | PRN
  Administered 2021-05-01 (×2): 50 ug via INTRAVENOUS

## 2021-05-01 MED ORDER — OXYBUTYNIN CHLORIDE ER 10 MG PO TB24: 10.0000 mg | ORAL_TABLET | Freq: Every day | ORAL | 0 refills | Status: DC | PRN

## 2021-05-01 MED ORDER — CHLORHEXIDINE GLUCONATE 0.12 % MT SOLN: 15.0000 mL | Freq: Once | OROMUCOSAL | Status: AC

## 2021-05-01 MED ORDER — FENTANYL CITRATE (PF) 100 MCG/2ML IJ SOLN
INTRAMUSCULAR | Status: AC
  Administered 2021-05-01: 25 ug via INTRAVENOUS
  Filled 2021-05-01: qty 2

## 2021-05-01 MED ORDER — FENTANYL CITRATE (PF) 100 MCG/2ML IJ SOLN
25.0000 ug | INTRAMUSCULAR | Status: DC | PRN
  Administered 2021-05-01: 25 ug via INTRAVENOUS
  Administered 2021-05-01: 50 ug via INTRAVENOUS

## 2021-05-01 MED ORDER — SUCCINYLCHOLINE CHLORIDE 200 MG/10ML IV SOSY
PREFILLED_SYRINGE | INTRAVENOUS | Status: DC | PRN
Start: 2021-05-01 — End: 2021-05-01
  Administered 2021-05-01: 140 mg via INTRAVENOUS

## 2021-05-01 MED ORDER — ACETAMINOPHEN 10 MG/ML IV SOLN
INTRAVENOUS | Status: AC
  Filled 2021-05-01: qty 100

## 2021-05-01 MED ORDER — OXYCODONE HCL 5 MG/5ML PO SOLN: 5.0000 mg | Freq: Once | ORAL | Status: AC | PRN

## 2021-05-01 MED ORDER — LIDOCAINE HCL (CARDIAC) PF 100 MG/5ML IV SOSY
PREFILLED_SYRINGE | INTRAVENOUS | Status: DC | PRN
  Administered 2021-05-01: 50 mg via INTRAVENOUS

## 2021-05-01 SURGICAL SUPPLY — 35 items
ADH LQ OCL WTPRF AMP STRL LF (MISCELLANEOUS) ×1
ADHESIVE MASTISOL STRL (MISCELLANEOUS) ×1 IMPLANT
BAG DRAIN CYSTO-URO LG1000N (MISCELLANEOUS) ×2 IMPLANT
BASKET ZERO TIP 1.9FR (BASKET) ×1 IMPLANT
BRUSH SCRUB EZ 1% IODOPHOR (MISCELLANEOUS) ×2 IMPLANT
BSKT STON RTRVL ZERO TP 1.9FR (BASKET) ×1
CATH URET FLEX-TIP 2 LUMEN 10F (CATHETERS) IMPLANT
CATH URETL OPEN 5X70 (CATHETERS) IMPLANT
CNTNR SPEC 2.5X3XGRAD LEK (MISCELLANEOUS)
CONT SPEC 4OZ STER OR WHT (MISCELLANEOUS)
CONT SPEC 4OZ STRL OR WHT (MISCELLANEOUS)
CONTAINER SPEC 2.5X3XGRAD LEK (MISCELLANEOUS) IMPLANT
DRAPE UTILITY 15X26 TOWEL STRL (DRAPES) ×2 IMPLANT
DRSG TEGADERM 2-3/8X2-3/4 SM (GAUZE/BANDAGES/DRESSINGS) ×1 IMPLANT
GAUZE 4X4 16PLY ~~LOC~~+RFID DBL (SPONGE) ×4 IMPLANT
GLOVE SURG UNDER POLY LF SZ7.5 (GLOVE) ×2 IMPLANT
GOWN STRL REUS W/ TWL LRG LVL3 (GOWN DISPOSABLE) ×1 IMPLANT
GOWN STRL REUS W/ TWL XL LVL3 (GOWN DISPOSABLE) ×1 IMPLANT
GOWN STRL REUS W/TWL LRG LVL3 (GOWN DISPOSABLE) ×2
GOWN STRL REUS W/TWL XL LVL3 (GOWN DISPOSABLE) ×2
GUIDEWIRE STR DUAL SENSOR (WIRE) ×3 IMPLANT
INFUSOR MANOMETER BAG 3000ML (MISCELLANEOUS) ×1 IMPLANT
IV NS IRRIG 3000ML ARTHROMATIC (IV SOLUTION) ×2 IMPLANT
KIT TURNOVER CYSTO (KITS) ×2 IMPLANT
PACK CYSTO AR (MISCELLANEOUS) ×2 IMPLANT
SET CYSTO W/LG BORE CLAMP LF (SET/KITS/TRAYS/PACK) ×2 IMPLANT
SHEATH URETERAL 12FRX35CM (MISCELLANEOUS) IMPLANT
STENT URET 6FRX24 CONTOUR (STENTS) IMPLANT
STENT URET 6FRX26 CONTOUR (STENTS) ×1 IMPLANT
SURGILUBE 2OZ TUBE FLIPTOP (MISCELLANEOUS) ×2 IMPLANT
SYR 10ML LL (SYRINGE) ×2 IMPLANT
TRACTIP FLEXIVA PULSE ID 200 (Laser) ×1 IMPLANT
VALVE UROSEAL ADJ ENDO (VALVE) ×1 IMPLANT
WATER STERILE IRR 1000ML POUR (IV SOLUTION) ×1 IMPLANT
WATER STERILE IRR 500ML POUR (IV SOLUTION) ×2 IMPLANT

## 2021-05-01 NOTE — Op Note (Signed)
Date of procedure: 05/01/21 ? ?Preoperative diagnosis:  ?Left mid ureteral stone ? ?Postoperative diagnosis:  ?Same ? ?Procedure: ?Cystoscopy, left ureteroscopy, laser lithotripsy, left retrograde pyelogram with intraoperative interpretation, left ureteral stent placement ? ?Surgeon: Nickolas Madrid, MD ? ?Anesthesia: General ? ?Complications: None ? ?Intraoperative findings:  ?Small prostate, normal cystoscopy ?Uncomplicated dusting of left mid ureteral stone and stent placement ? ?EBL: None ? ?Specimens: None ? ?Drains: Left 6 French by 26 cm ureteral stent with Dangler ? ?Indication: Jerome Graves is a 56 y.o. patient with 8 mm left mid ureteral stone and ongoing renal colic.  He was originally scheduled for shockwave lithotripsy today, but his blood pressure was uncontrolled and stone was not visible on KUB.  He opted for ureteroscopy.  After reviewing the management options for treatment, they elected to proceed with the above surgical procedure(s). We have discussed the potential benefits and risks of the procedure, side effects of the proposed treatment, the likelihood of the patient achieving the goals of the procedure, and any potential problems that might occur during the procedure or recuperation. Informed consent has been obtained. ? ?Description of procedure: ? ?The patient was taken to the operating room and general anesthesia was induced. SCDs were placed for DVT prophylaxis.. The patient was placed in the dorsal lithotomy position, prepped and draped in the usual sterile fashion, and preoperative antibiotics(Ancef) were administered. A preoperative time-out was performed.  ? ?A 21 French rigid cystoscope was used to intubate the urethra and a normal-appearing urethra was followed proximally into the bladder.  The prostate was small.  Thorough cystoscopy showed no suspicious lesions, and the ureteral orifices were orthotopic bilaterally. ? ?A sensor wire advanced easily into the left ureteral orifice  and up into the kidney under fluoroscopic vision.  A semirigid long ureteroscope advanced easily alongside the wire and I identified a impacted yellow stone in the mid ureter.  A 200 ?m fiber on settings of 1.0 J and 10 Hz was used to fragment the stone.  I attempted to basket out the largest fragment, but this met resistance in the narrow mid ureter where the stone was previously impacted.  The stone was just out of reach of the semirigid ureteroscope.  A second sensor wire was added into the kidney, and the semirigid scope removed.  A single channel digital flexible ureteroscope advanced easily over the wire into the kidney.  Thorough pyeloscopy revealed a few small 3 mm fragments that remain, and these were fragmented to dust with the laser on previously mentioned settings.  Thorough pyeloscopy revealed no other stones.  Retrograde pyelogram was performed from the proximal ureter and showed no extravasation or filling defects.  Careful pullback ureteroscopy showed no residual stones or ureteral injury, there was mild to moderate edema in the mid ureter where the stone had previously been lodged. ? ?The rigid cystoscope was backloaded over the wire and a 6 [place] by 26 cm ureteral stent was uneventfully placed with an excellent curl in the renal pelvis, as well as in the bladder.  Fluid drained through the side ports of the stent.  The bladder was irrigated free of the small stone fragments and drained.  The Dangler was secured to the dorsal aspect of the penis using Mastisol and Tegaderm. ? ?Disposition: Stable to PACU ? ?Plan: ?Remove stent at home on Tuesday morning 05/06/2021 ?Keflex prophylaxis with stent in place ?Follow-up in clinic in 2 months with 24-hour urine test prior ? ?Nickolas Madrid, MD ? ?

## 2021-05-01 NOTE — Interval H&P Note (Signed)
UROLOGY H&P UPDATE ? ?Original H&P by Dr. Apolinar Junes on 04/29/2021, 56 year old male with 9 mm left mid ureteral stone who was originally scheduled for shockwave lithotripsy today.  In preop his blood pressure remained significantly elevated at 195/120 despite multiple rechecks, and taking blood pressure medications this morning.  In addition on KUB today, stone is very difficult to visualize, and suspect over the sacrum/vertebral column.  With his significant hypertension, difficult to visualize stone on KUB, I strongly recommended left ureteroscopy/laser lithotripsy/stent placement today.  We specifically discussed the risks ureteroscopy including bleeding, infection/sepsis, stent related symptoms including flank pain/urgency/frequency/incontinence/dysuria, ureteral injury, inability to access stone, or need for staged or additional procedures. ? ? ?Cardiac: RRR ?Lungs: CTA bilaterally ? ?Laterality: Left ?Procedure: Left ureteroscopy, laser lithotripsy, stent placement ? ?Urine: UA with microscopic hematuria but otherwise benign ? ?We specifically discussed the risks ureteroscopy including bleeding, infection/sepsis, stent related symptoms including flank pain/urgency/frequency/incontinence/dysuria, ureteral injury, inability to access stone, or need for staged or additional procedures. ? ? ?Sondra Come, MD ?05/01/2021 ? ? ? ?

## 2021-05-01 NOTE — Discharge Instructions (Signed)

## 2021-05-01 NOTE — Transfer of Care (Signed)
Immediate Anesthesia Transfer of Care Note ? ?Patient: Jerome Graves ? ?Procedure(s) Performed: CYSTOSCOPY/URETEROSCOPY/HOLMIUM LASER/STENT PLACEMENT (Left) ?CYSTOSCOPY WITH RETROGRADE PYELOGRAM (Left) ? ?Patient Location: PACU ? ?Anesthesia Type:General ? ?Level of Consciousness: awake, alert  and oriented ? ?Airway & Oxygen Therapy: Patient Spontanous Breathing and Patient connected to face mask oxygen ? ?Post-op Assessment: Report given to RN and Post -op Vital signs reviewed and stable ? ?Post vital signs: Reviewed and stable ? ?Last Vitals:  ?Vitals Value Taken Time  ?BP 160/118   ?Temp    ?Pulse 97 05/01/21 1705  ?Resp 19 05/01/21 1705  ?SpO2 99 % 05/01/21 1705  ?Vitals shown include unvalidated device data. ? ?Last Pain:  ?Vitals:  ? 05/01/21 1425  ?TempSrc: Oral  ?   ? ?  ? ?Complications: No notable events documented. ?

## 2021-05-01 NOTE — Progress Notes (Signed)
Patient's BP was elevated this morning 191/124.  I notified Dr. Richardo Hanks, he came up to the unit to talk to the patient.  They discussed changing the procedure to a ureteroscopy.  Dr. Richardo Hanks talked to the PR and they are able to do the surgery this afternoon.  The patient left, but was instructed not to eat or drink and to keep his phone close incase there was a cancellation, but if not then he should be here between 2-230pm.  He acknowledged and walked out to the lobby. ?

## 2021-05-01 NOTE — Anesthesia Preprocedure Evaluation (Addendum)
Anesthesia Evaluation  ?Patient identified by MRN, date of birth, ID band ?Patient awake ? ? ? ?Reviewed: ?Allergy & Precautions, NPO status , Patient's Chart, lab work & pertinent test results ? ?History of Anesthesia Complications ?Negative for: history of anesthetic complications ? ?Airway ?Mallampati: III ? ?TM Distance: <3 FB ?Neck ROM: full ? ? ? Dental ? ?(+) Chipped ?  ?Pulmonary ?neg pulmonary ROS, neg shortness of breath,  ?  ?Pulmonary exam normal ? ? ? ? ? ? ? Cardiovascular ?Exercise Tolerance: Good ?hypertension, (-) angina(-) Past MI and (-) DOE Normal cardiovascular exam ? ? ?  ?Neuro/Psych ?negative neurological ROS ? negative psych ROS  ? GI/Hepatic ?negative GI ROS, Neg liver ROS, GERD  Controlled,  ?Endo/Other  ?negative endocrine ROS ? Renal/GU ?Renal disease  ? ?  ?Musculoskeletal ? ? Abdominal ?  ?Peds ? Hematology ?negative hematology ROS ?(+)   ?Anesthesia Other Findings ?Past Medical History: ?No date: GERD (gastroesophageal reflux disease) ?No date: Gross hematuria ?No date: History of kidney stones ?No date: Hypertension ?No date: Renal calculus, right ?No date: Right flank pain ? ?Past Surgical History: ?09/13/2013: CYSTOSCOPY WITH RETROGRADE PYELOGRAM, URETEROSCOPY AND  ?STENT PLACEMENT; Bilateral ?    Comment:  Procedure: CYSTOSCOPY WITH BILATERAL RETROGRADE  ?             PYELOGRAM, RIGHT URETEROSCOPY AND STENT PLACEMENT;   ?             Surgeon: Alexis Frock, MD;  [facility];  Service: Urology;  Laterality:  ?             Bilateral; ?09/13/2013: HOLMIUM LASER APPLICATION; Right ?    Comment:  Procedure: HOLMIUM LASER APPLICATION;  Surgeon: Hubbard Robinson ?             Tresa Moore, MD;  Location: [facility];   ?             Service: Urology;  Laterality: Right; ?No date: NEGATIVE SLEEP STUDY ?    Comment:  2003 -- PER PT ?2010: PERCUTANEOUS NEPHROLITHOTRIPSY; Left ?YRS AGO: PINNING RIGHT RING FINGER  FX ? ? ? ? Reproductive/Obstetrics ?negative OB ROS ? ?  ? ? ? ? ? ? ? ? ? ? ? ? ? ?  ?  ? ? ? ? ? ? ? ? ?Anesthesia Physical ?Anesthesia Plan ? ?ASA: 3 ? ?Anesthesia Plan: General ETT  ? ?Post-op Pain Management:   ? ?Induction: Intravenous ? ?PONV Risk Score and Plan: Ondansetron, Dexamethasone, Midazolam and Treatment may vary due to age or medical condition ? ?Airway Management Planned: Oral ETT and Video Laryngoscope Planned ? ?Additional Equipment:  ? ?Intra-op Plan:  ? ?Post-operative Plan: Extubation in OR ? ?Informed Consent: I have reviewed the patients History and Physical, chart, labs and discussed the procedure including the risks, benefits and alternatives for the proposed anesthesia with the patient or authorized representative who has indicated his/her understanding and acceptance.  ? ? ? ?Dental Advisory Given ? ?Plan Discussed with: Anesthesiologist, CRNA and Surgeon ? ?Anesthesia Plan Comments: (Patient consented for risks of anesthesia including but not limited to:  ?- adverse reactions to medications ?- damage to eyes, teeth, lips or other oral mucosa ?- nerve damage due to positioning  ?- sore throat or hoarseness ?- Damage to heart, brain, nerves, lungs, other parts of body or loss of life ? ?Patient voiced understanding.)  ? ? ? ? ? ?  Anesthesia Quick Evaluation ? ?

## 2021-05-01 NOTE — Anesthesia Procedure Notes (Signed)
Procedure Name: Intubation ?Date/Time: 05/01/2021 4:20 PM ?Performed by: Lily Peer, Nariyah Osias, CRNA ?Pre-anesthesia Checklist: Patient identified, Emergency Drugs available, Suction available and Patient being monitored ?Patient Re-evaluated:Patient Re-evaluated prior to induction ?Oxygen Delivery Method: Circle system utilized ?Preoxygenation: Pre-oxygenation with 100% oxygen ?Induction Type: IV induction ?Ventilation: Mask ventilation without difficulty ?Laryngoscope Size: McGraph and 4 ?Grade View: Grade I ?Tube type: Oral ?Tube size: 7.5 mm ?Number of attempts: 1 ?Airway Equipment and Method: Stylet ?Placement Confirmation: ETT inserted through vocal cords under direct vision, positive ETCO2 and breath sounds checked- equal and bilateral ?Secured at: 21 cm ?Tube secured with: Tape ?Dental Injury: Teeth and Oropharynx as per pre-operative assessment  ? ? ? ? ?

## 2021-05-02 ENCOUNTER — Encounter: Payer: Self-pay | Admitting: Urology

## 2021-05-02 LAB — CULTURE, URINE COMPREHENSIVE

## 2021-05-02 NOTE — Anesthesia Postprocedure Evaluation (Signed)
Anesthesia Post Note ? ?Patient: Jerome Graves ? ?Procedure(s) Performed: CYSTOSCOPY/URETEROSCOPY/HOLMIUM LASER/STENT PLACEMENT (Left) ?CYSTOSCOPY WITH RETROGRADE PYELOGRAM (Left) ? ?Patient location during evaluation: PACU ?Anesthesia Type: General ?Level of consciousness: awake and alert ?Pain management: pain level controlled ?Vital Signs Assessment: post-procedure vital signs reviewed and stable ?Respiratory status: spontaneous breathing, nonlabored ventilation, respiratory function stable and patient connected to nasal cannula oxygen ?Cardiovascular status: blood pressure returned to baseline and stable ?Postop Assessment: no apparent nausea or vomiting ?Anesthetic complications: no ? ? ?No notable events documented. ? ? ?Last Vitals:  ?Vitals:  ? 05/01/21 1745 05/01/21 1756  ?BP: (!) 178/104 (!) 181/113  ?Pulse: 88 96  ?Resp: 20 20  ?Temp:  36.7 ?C  ?SpO2: 90% 97%  ?  ?Last Pain:  ?Vitals:  ? 05/01/21 1756  ?TempSrc: Temporal  ?PainSc: 7   ? ? ?  ?  ?  ?  ?  ?  ? ?Jerome Graves ? ? ? ? ?

## 2021-05-06 ENCOUNTER — Other Ambulatory Visit: Payer: Self-pay | Admitting: Urology

## 2021-05-06 ENCOUNTER — Encounter: Payer: Self-pay | Admitting: Physician Assistant

## 2021-05-06 ENCOUNTER — Other Ambulatory Visit: Payer: Self-pay

## 2021-05-06 ENCOUNTER — Ambulatory Visit (INDEPENDENT_AMBULATORY_CARE_PROVIDER_SITE_OTHER): Payer: BC Managed Care – PPO | Admitting: Physician Assistant

## 2021-05-06 VITALS — BP 136/99 | HR 118 | Temp 97.3°F | Resp 16 | Ht 72.0 in | Wt 242.0 lb

## 2021-05-06 DIAGNOSIS — Z114 Encounter for screening for human immunodeficiency virus [HIV]: Secondary | ICD-10-CM

## 2021-05-06 DIAGNOSIS — I10 Essential (primary) hypertension: Secondary | ICD-10-CM

## 2021-05-06 DIAGNOSIS — Z1159 Encounter for screening for other viral diseases: Secondary | ICD-10-CM

## 2021-05-06 DIAGNOSIS — R739 Hyperglycemia, unspecified: Secondary | ICD-10-CM

## 2021-05-06 MED ORDER — CHLORTHALIDONE 25 MG PO TABS: 25.0000 mg | ORAL_TABLET | Freq: Every day | ORAL | 2 refills | Status: DC

## 2021-05-06 MED ORDER — LOSARTAN POTASSIUM 100 MG PO TABS: 100.0000 mg | ORAL_TABLET | Freq: Every day | ORAL | 2 refills | Status: DC

## 2021-05-06 NOTE — Assessment & Plan Note (Signed)
Recurrent concern ?Currently not managed ?Patient had several elevated blood glucose levels in ED  ?Recommend he return for repeat testing along with A1c  ?Orders placed today for him to repeat this at his convenience.  ?Results to dictate further management  ?Recommend follow up in 3 months with PCP ?

## 2021-05-06 NOTE — Progress Notes (Signed)
?  ? ? ?Established patient visit ? ? ?Patient: Jerome Graves   DOB: August 08, 1965   56 y.o. Male  MRN: [id number] ?Visit Date: 05/06/2021 ? ?Today's healthcare provider: Oswaldo Conroy Clarisse Rodriges, PA-C  ?Introduced myself to the patient as a Secondary school teacher and provided education on APPs in clinical practice.  ? ? ?Chief Complaint  ?Patient presents with  ? Hypertension  ? ?Subjective  ?  ?HPI  ?Hypertension, follow-up ? ?BP Readings from Last 3 Encounters:  ?05/06/21 (!) 136/99  ?05/01/21 (!) 181/113  ?04/29/21 (!) 195/132  ? Wt Readings from Last 3 Encounters:  ?05/06/21 242 lb (109.8 kg)  ?04/29/21 252 lb (114.3 kg)  ?04/26/21 245 lb (111.1 kg)  ?  ? ?He was last seen for hypertension 1 years and 11 months ago.  ?BP at that visit was 192/111. Management since that visit includes Will restart Losartan and Chlorthalidone. . ? ?He reports fair compliance with treatment. ?He is not having side effects. Denies lightheadedness or dizziness while taking  ?He is following a Regular diet. ?He is not exercising. ?He does not smoke. ? ?Outside blood pressures are not being checked. ?Symptoms: ?No chest pain No chest pressure  ?No palpitations No syncope  ?No dyspnea No orthopnea  ?No paroxysmal nocturnal dyspnea No lower extremity edema  ? ?Pertinent labs: ?No results found for: CHOL, HDL, LDLCALC, LDLDIRECT, TRIG, CHOLHDL Lab Results  ?Component Value Date  ? NA 135 04/26/2021  ? K 3.1 (L) 04/26/2021  ? CREATININE 1.17 04/26/2021  ? GFRNONAA >60 04/26/2021  ? GLUCOSE 375 (H) 04/26/2021  ?  ? ?The ASCVD Risk score (Arnett DK, et al., 2019) failed to calculate for the following reasons: ?  Cannot find a previous HDL lab ?  Cannot find a previous total cholesterol lab  ? ? ? ? ? ?---------------------------------------------------------------------------------------------------  ? ?Medications: ?Outpatient Medications Prior to Visit  ?Medication Sig  ? albuterol (VENTOLIN HFA) 108 (90 Base) MCG/ACT inhaler Inhale 2 puffs into the lungs every 6 (six)  hours as needed for wheezing or shortness of breath.  ? cephALEXin (KEFLEX) 500 MG capsule Take 1 capsule (500 mg total) by mouth daily.  ? ibuprofen (ADVIL) 600 MG tablet Take 1 tablet (600 mg total) by mouth every 6 (six) hours as needed.  ? ondansetron (ZOFRAN-ODT) 8 MG disintegrating tablet Take 1 tablet (8 mg total) by mouth every 8 (eight) hours as needed for nausea or vomiting.  ? oxybutynin (DITROPAN XL) 10 MG 24 hr tablet Take 1 tablet (10 mg total) by mouth daily as needed (bladder spasms/bladder pain).  ? tamsulosin (FLOMAX) 0.4 MG CAPS capsule Take 1 capsule (0.4 mg total) by mouth daily for 14 days.  ? valACYclovir (VALTREX) 1000 MG tablet Take 1 tablet (1,000 mg total) by mouth 2 (two) times daily.  ? [DISCONTINUED] chlorthalidone (HYGROTON) 25 MG tablet TAKE 1 TABLET(25 MG) BY MOUTH DAILY (Patient not taking: Reported on 05/06/2021)  ? [DISCONTINUED] losartan (COZAAR) 100 MG tablet TAKE 1 TABLET(100 MG) BY MOUTH DAILY (Patient not taking: Reported on 05/06/2021)  ? ?No facility-administered medications prior to visit.  ? ? ?Review of Systems  ?Constitutional:  Negative for fatigue.  ?Respiratory:  Negative for shortness of breath.   ?Cardiovascular:  Negative for chest pain, palpitations and leg swelling.  ?Musculoskeletal:  Negative for back pain.  ?Neurological:  Negative for dizziness, weakness, light-headedness and headaches.  ? ? ?  Objective  ?  ?BP (!) 136/99 (BP Location: Left Arm, Patient Position: Sitting, Cuff Size: Large)  Pulse (!) 118   Temp (!) 97.3 ?F (36.3 ?C) (Temporal)   Resp 16   Ht 6' (1.829 m)   Wt 242 lb (109.8 kg)   BMI 32.82 kg/m?  ? ? ?Physical Exam ?Vitals reviewed.  ?Constitutional:   ?   Appearance: Normal appearance. He is obese.  ?HENT:  ?   Head: Normocephalic and atraumatic.  ?Eyes:  ?   General: Lids are normal.  ?   Extraocular Movements: Extraocular movements intact.  ?   Conjunctiva/sclera: Conjunctivae normal.  ?Cardiovascular:  ?   Rate and Rhythm: Normal rate  and regular rhythm.  ?   Pulses: Normal pulses.  ?   Heart sounds: Normal heart sounds.  ?Pulmonary:  ?   Effort: Pulmonary effort is normal.  ?   Breath sounds: Normal breath sounds and air entry. No decreased breath sounds, wheezing, rhonchi or rales.  ?Musculoskeletal:  ?   Right lower leg: No edema.  ?   Left lower leg: No edema.  ?Neurological:  ?   Mental Status: He is alert.  ?Psychiatric:     ?   Attention and Perception: Attention and perception normal.     ?   Speech: Speech normal.     ?   Behavior: Behavior normal. Behavior is cooperative.  ?  ? ? ?No results found for any visits on 05/06/21. ? Assessment & Plan  ?  ? ?Problem List Items Addressed This Visit   ? ?  ? Cardiovascular and Mediastinum  ? Hypertension  ?  Chronic, historic concern ?Patient has been out of BP medication for some time ?Refills provided today, discussed lifestyle modifications of exercise and heart healthy diet to further improve  ?Recommend follow up in 3 months with new PCP to establish care and assess progress ? ?  ?  ? Relevant Medications  ? chlorthalidone (HYGROTON) 25 MG tablet  ? losartan (COZAAR) 100 MG tablet  ? Other Relevant Orders  ? CBC w/Diff/Platelet  ? Lipid Profile  ?  ? Other  ? Blood glucose elevated - Primary  ?  Recurrent concern ?Currently not managed ?Patient had several elevated blood glucose levels in ED  ?Recommend he return for repeat testing along with A1c  ?Orders placed today for him to repeat this at his convenience.  ?Results to dictate further management  ?Recommend follow up in 3 months with PCP ?  ?  ? Relevant Orders  ? Comprehensive Metabolic Panel (CMET)  ? HgB A1c  ? ?Other Visit Diagnoses   ? ? Screening for HIV (human immunodeficiency virus)      ? Relevant Orders  ? HIV antibody (with reflex)  ? Encounter for hepatitis C screening test for low risk patient      ? Relevant Orders  ? Hepatitis C antibody  ? ?  ? ?Provided information on his currently overdue health maintenance tasks: Tdap  vaccine, shingles vaccine and colon cancer screening. Discussed Cologuard and Colonoscopy as options and provided education on prep and procedure required for each. (This discussion was approx 5 minutes of apt) ?Recommended he follow up with PCP for further preventive health maintenance discussion and implementation.  ? ? ?Return in about 3 months (around 08/06/2021) for physical and lab review. ? ? ?I, Reesha Debes E Urho Rio, PA-C, have reviewed all documentation for this visit. The documentation on 05/06/21 for the exam, diagnosis, procedures, and orders are all accurate and complete. ? ? ?Nesha Counihan, Mirian Mo MPH ?[facility] ?[facility] ? ? ?  Return in about 3 months (around 08/06/2021) for physical and lab review.  ?   ? ? ? ?Lilyth Lawyer E Amelianna Meller, PA-C  ?[facility] ?[phone] (phone) ?[phone] (fax) ? ?[facility]  ?

## 2021-05-06 NOTE — Patient Instructions (Addendum)
Please come back for your lab work after fasting for 8 hours - nothing but water or black coffee at least 8 hours prior to the lab draw ?You do not need an apt, just let the staff at the front desk know you are here for labs. ? ?Please schedule a physical with your new PCP - Alfredia Ferguson, PA-C over the next few months. ? ?Let us know if you need refills or have any further questions about your medications.  ?

## 2021-05-06 NOTE — Assessment & Plan Note (Signed)
Chronic, historic concern ?Patient has been out of BP medication for some time ?Refills provided today, discussed lifestyle modifications of exercise and heart healthy diet to further improve  ?Recommend follow up in 3 months with new PCP to establish care and assess progress ? ?

## 2021-05-13 ENCOUNTER — Other Ambulatory Visit: Payer: Self-pay | Admitting: Urology

## 2021-05-15 DIAGNOSIS — I10 Essential (primary) hypertension: Secondary | ICD-10-CM | POA: Diagnosis not present

## 2021-05-15 DIAGNOSIS — R739 Hyperglycemia, unspecified: Secondary | ICD-10-CM | POA: Diagnosis not present

## 2021-05-15 DIAGNOSIS — Z114 Encounter for screening for human immunodeficiency virus [HIV]: Secondary | ICD-10-CM | POA: Diagnosis not present

## 2021-05-16 ENCOUNTER — Emergency Department
Admission: EM | Admit: 2021-05-16 | Discharge: 2021-05-16 | Disposition: A | Discharge status: Home / Self Care | Payer: BC Managed Care – PPO | Attending: Emergency Medicine | Admitting: Emergency Medicine

## 2021-05-16 ENCOUNTER — Telehealth: Payer: Self-pay

## 2021-05-16 ENCOUNTER — Ambulatory Visit: Payer: Self-pay | Admitting: *Deleted

## 2021-05-16 ENCOUNTER — Telehealth: Payer: Self-pay | Admitting: *Deleted

## 2021-05-16 ENCOUNTER — Encounter: Payer: Self-pay | Admitting: Physician Assistant

## 2021-05-16 ENCOUNTER — Ambulatory Visit (INDEPENDENT_AMBULATORY_CARE_PROVIDER_SITE_OTHER): Payer: BC Managed Care – PPO | Admitting: Physician Assistant

## 2021-05-16 ENCOUNTER — Other Ambulatory Visit: Payer: Self-pay

## 2021-05-16 VITALS — BP 129/107 | HR 173 | Temp 97.5°F | Ht 72.0 in

## 2021-05-16 DIAGNOSIS — N179 Acute kidney failure, unspecified: Secondary | ICD-10-CM | POA: Diagnosis not present

## 2021-05-16 DIAGNOSIS — E785 Hyperlipidemia, unspecified: Secondary | ICD-10-CM

## 2021-05-16 DIAGNOSIS — E1169 Type 2 diabetes mellitus with other specified complication: Secondary | ICD-10-CM | POA: Insufficient documentation

## 2021-05-16 DIAGNOSIS — I10 Essential (primary) hypertension: Secondary | ICD-10-CM

## 2021-05-16 DIAGNOSIS — R0789 Other chest pain: Secondary | ICD-10-CM

## 2021-05-16 DIAGNOSIS — I471 Supraventricular tachycardia, unspecified: Secondary | ICD-10-CM | POA: Insufficient documentation

## 2021-05-16 DIAGNOSIS — E1165 Type 2 diabetes mellitus with hyperglycemia: Secondary | ICD-10-CM | POA: Diagnosis not present

## 2021-05-16 DIAGNOSIS — R739 Hyperglycemia, unspecified: Secondary | ICD-10-CM

## 2021-05-16 DIAGNOSIS — R000 Tachycardia, unspecified: Secondary | ICD-10-CM | POA: Diagnosis not present

## 2021-05-16 DIAGNOSIS — E860 Dehydration: Secondary | ICD-10-CM | POA: Diagnosis not present

## 2021-05-16 DIAGNOSIS — E119 Type 2 diabetes mellitus without complications: Secondary | ICD-10-CM

## 2021-05-16 LAB — LIPID PANEL
Chol/HDL Ratio: 8.3 ratio — ABNORMAL HIGH (ref 0.0–5.0)
Cholesterol, Total: 298 mg/dL — ABNORMAL HIGH (ref 100–199)
HDL: 36 mg/dL — ABNORMAL LOW (ref >39)
Triglycerides: 1074 mg/dL (ref 0–149)

## 2021-05-16 LAB — URINALYSIS, ROUTINE W REFLEX MICROSCOPIC
Bacteria, UA: NONE SEEN
Bilirubin Urine: NEGATIVE
Glucose, UA: >=500 mg/dL — AB
Hgb urine dipstick: NEGATIVE
Ketones, ur: 20 mg/dL — AB
Leukocytes,Ua: NEGATIVE
Nitrite: NEGATIVE
Protein, ur: NEGATIVE mg/dL
Specific Gravity, Urine: 1.021 (ref 1.005–1.030)
Squamous Epithelial / HPF: NONE SEEN (ref 0–5)
pH: 5 (ref 5.0–8.0)

## 2021-05-16 LAB — COMPREHENSIVE METABOLIC PANEL
ALT: 13 IU/L (ref 0–44)
ALT: 15 U/L (ref 0–44)
AST: 15 IU/L (ref 0–40)
AST: 17 U/L (ref 15–41)
Albumin/Globulin Ratio: 1.5 (ref 1.2–2.2)
Albumin: 4.6 g/dL (ref 3.8–4.9)
Albumin: 4.3 g/dL (ref 3.5–5.0)
Alkaline Phosphatase: 117 IU/L (ref 44–121)
Alkaline Phosphatase: 102 U/L (ref 38–126)
Anion gap: 17 — ABNORMAL HIGH (ref 5–15)
BUN/Creatinine Ratio: 16 (ref 9–20)
BUN: 27 mg/dL — ABNORMAL HIGH (ref 6–24)
BUN: 37 mg/dL — ABNORMAL HIGH (ref 6–20)
Bilirubin Total: 2 mg/dL — ABNORMAL HIGH (ref 0.0–1.2)
CO2: 27 mmol/L (ref 20–29)
CO2: 26 mmol/L (ref 22–32)
Calcium: 10.5 mg/dL — ABNORMAL HIGH (ref 8.7–10.2)
Calcium: 9.6 mg/dL (ref 8.9–10.3)
Chloride: 88 mmol/L — ABNORMAL LOW (ref 96–106)
Chloride: 88 mmol/L — ABNORMAL LOW (ref 98–111)
Creatinine, Ser: 1.66 mg/dL — ABNORMAL HIGH (ref 0.76–1.27)
Creatinine, Ser: 1.8 mg/dL — ABNORMAL HIGH (ref 0.61–1.24)
GFR, Estimated: 44 mL/min — ABNORMAL LOW (ref >60)
Globulin, Total: 3 g/dL (ref 1.5–4.5)
Glucose, Bld: 496 mg/dL — ABNORMAL HIGH (ref 70–99)
Glucose: 325 mg/dL — ABNORMAL HIGH (ref 70–99)
Potassium: 3.8 mmol/L (ref 3.5–5.2)
Potassium: 3.5 mmol/L (ref 3.5–5.1)
Sodium: 134 mmol/L (ref 134–144)
Sodium: 131 mmol/L — ABNORMAL LOW (ref 135–145)
Total Bilirubin: 2.3 mg/dL — ABNORMAL HIGH (ref 0.3–1.2)
Total Protein: 7.6 g/dL (ref 6.0–8.5)
Total Protein: 8.2 g/dL — ABNORMAL HIGH (ref 6.5–8.1)
eGFR: 48 mL/min/{1.73_m2} — ABNORMAL LOW (ref >59)

## 2021-05-16 LAB — CBC WITH DIFFERENTIAL/PLATELET
Abs Immature Granulocytes: 0.09 10*3/uL — ABNORMAL HIGH (ref 0.00–0.07)
Basophils Absolute: 0.1 10*3/uL (ref 0.0–0.2)
Basophils Absolute: 0.1 10*3/uL (ref 0.0–0.1)
Basophils Relative: 1 %
Basos: 1 %
EOS (ABSOLUTE): 0.2 10*3/uL (ref 0.0–0.4)
Eos: 2 %
Eosinophils Absolute: 0.2 10*3/uL (ref 0.0–0.5)
Eosinophils Relative: 2 %
HCT: 48.7 % (ref 39.0–52.0)
Hematocrit: 50.8 % (ref 37.5–51.0)
Hemoglobin: 17.2 g/dL (ref 13.0–17.7)
Hemoglobin: 16.8 g/dL (ref 13.0–17.0)
Immature Grans (Abs): 0 10*3/uL (ref 0.0–0.1)
Immature Granulocytes: 0 %
Immature Granulocytes: 1 %
Lymphocytes Absolute: 1.9 10*3/uL (ref 0.7–3.1)
Lymphocytes Relative: 17 %
Lymphs Abs: 2.5 10*3/uL (ref 0.7–4.0)
Lymphs: 17 %
MCH: 30.1 pg (ref 26.6–33.0)
MCH: 29.6 pg (ref 26.0–34.0)
MCHC: 33.9 g/dL (ref 31.5–35.7)
MCHC: 34.5 g/dL (ref 30.0–36.0)
MCV: 89 fL (ref 79–97)
MCV: 85.9 fL (ref 80.0–100.0)
Monocytes Absolute: 0.8 10*3/uL (ref 0.1–0.9)
Monocytes Absolute: 1.2 10*3/uL — ABNORMAL HIGH (ref 0.1–1.0)
Monocytes Relative: 8 %
Monocytes: 7 %
Neutro Abs: 11.1 10*3/uL — ABNORMAL HIGH (ref 1.7–7.7)
Neutrophils Absolute: 8 10*3/uL — ABNORMAL HIGH (ref 1.4–7.0)
Neutrophils Relative %: 71 %
Neutrophils: 73 %
Platelets: 296 10*3/uL (ref 150–450)
Platelets: 344 10*3/uL (ref 150–400)
RBC: 5.71 x10E6/uL (ref 4.14–5.80)
RBC: 5.67 MIL/uL (ref 4.22–5.81)
RDW: 12.2 % (ref 11.6–15.4)
RDW: 11.7 % (ref 11.5–15.5)
WBC: 10.9 10*3/uL — ABNORMAL HIGH (ref 3.4–10.8)
WBC: 15.2 10*3/uL — ABNORMAL HIGH (ref 4.0–10.5)
nRBC: 0 % (ref 0.0–0.2)

## 2021-05-16 LAB — BETA-HYDROXYBUTYRIC ACID: Beta-Hydroxybutyric Acid: 2.02 mmol/L — ABNORMAL HIGH (ref 0.05–0.27)

## 2021-05-16 LAB — POCT URINALYSIS DIPSTICK
Bilirubin, UA: NEGATIVE
Blood, UA: NEGATIVE
Glucose, UA: POSITIVE — AB
Leukocytes, UA: NEGATIVE
Nitrite, UA: NEGATIVE
Protein, UA: NEGATIVE
Spec Grav, UA: 1.01 (ref 1.010–1.025)
Urobilinogen, UA: 0.2 E.U./dL
pH, UA: 6 (ref 5.0–8.0)

## 2021-05-16 LAB — BASIC METABOLIC PANEL
Anion gap: 16 — ABNORMAL HIGH (ref 5–15)
BUN: 32 mg/dL — ABNORMAL HIGH (ref 6–20)
CO2: 28 mmol/L (ref 22–32)
Calcium: 9.2 mg/dL (ref 8.9–10.3)
Chloride: 92 mmol/L — ABNORMAL LOW (ref 98–111)
Creatinine, Ser: 1.51 mg/dL — ABNORMAL HIGH (ref 0.61–1.24)
GFR, Estimated: 54 mL/min — ABNORMAL LOW (ref >60)
Glucose, Bld: 257 mg/dL — ABNORMAL HIGH (ref 70–99)
Potassium: 2.8 mmol/L — ABNORMAL LOW (ref 3.5–5.1)
Sodium: 136 mmol/L (ref 135–145)

## 2021-05-16 LAB — HEMOGLOBIN A1C
Est. average glucose Bld gHb Est-mCnc: 278 mg/dL
Hgb A1c MFr Bld: 11.3 % — ABNORMAL HIGH (ref 4.8–5.6)

## 2021-05-16 LAB — ETHANOL: Alcohol, Ethyl (B): <10 mg/dL (ref <10)

## 2021-05-16 LAB — LIPASE, BLOOD: Lipase: 49 U/L (ref 11–51)

## 2021-05-16 LAB — HIV ANTIBODY (ROUTINE TESTING W REFLEX): HIV Screen 4th Generation wRfx: NONREACTIVE

## 2021-05-16 LAB — CBG MONITORING, ED
Glucose-Capillary: 482 mg/dL — ABNORMAL HIGH (ref 70–99)
Glucose-Capillary: 347 mg/dL — ABNORMAL HIGH (ref 70–99)

## 2021-05-16 MED ORDER — ADENOSINE 6 MG/2ML IV SOLN: 6.0000 mg | Freq: Once | INTRAVENOUS | Status: AC

## 2021-05-16 MED ORDER — SODIUM CHLORIDE 0.9 % IV BOLUS
1000.0000 mL | Freq: Once | INTRAVENOUS | Status: AC
  Administered 2021-05-16: 1000 mL via INTRAVENOUS

## 2021-05-16 MED ORDER — ADENOSINE 12 MG/4ML IV SOLN
INTRAVENOUS | Status: AC
  Filled 2021-05-16: qty 4

## 2021-05-16 MED ORDER — LACTATED RINGERS IV BOLUS
1000.0000 mL | Freq: Once | INTRAVENOUS | Status: AC
Start: 2021-05-16 — End: 2021-05-16
  Administered 2021-05-16: 1000 mL via INTRAVENOUS

## 2021-05-16 MED ORDER — ATORVASTATIN CALCIUM 20 MG PO TABS: 20.0000 mg | ORAL_TABLET | Freq: Every day | ORAL | 3 refills | Status: DC

## 2021-05-16 MED ORDER — BLOOD GLUCOSE MONITOR KIT: PACK | 0 refills | Status: AC

## 2021-05-16 MED ORDER — METFORMIN HCL 500 MG PO TABS
500.0000 mg | ORAL_TABLET | Freq: Once | ORAL | Status: AC
  Administered 2021-05-16: 500 mg via ORAL
  Filled 2021-05-16: qty 1

## 2021-05-16 MED ORDER — ADENOSINE 6 MG/2ML IV SOLN
INTRAVENOUS | Status: AC
  Administered 2021-05-16: 6 mg via INTRAVENOUS
  Filled 2021-05-16: qty 2

## 2021-05-16 MED ORDER — METFORMIN HCL 500 MG PO TABS: 500.0000 mg | ORAL_TABLET | Freq: Two times a day (BID) | ORAL | 3 refills | Status: DC

## 2021-05-16 MED ORDER — INSULIN ASPART 100 UNIT/ML IJ SOLN
6.0000 [IU] | Freq: Once | INTRAMUSCULAR | Status: AC
Start: 2021-05-16 — End: 2021-05-16
  Administered 2021-05-16: 6 [IU] via INTRAVENOUS
  Filled 2021-05-16: qty 1

## 2021-05-16 MED ORDER — POTASSIUM CHLORIDE CRYS ER 20 MEQ PO TBCR
60.0000 meq | EXTENDED_RELEASE_TABLET | Freq: Once | ORAL | Status: AC
Start: 2021-05-16 — End: 2021-05-16
  Administered 2021-05-16: 60 meq via ORAL
  Filled 2021-05-16: qty 3

## 2021-05-16 NOTE — Assessment & Plan Note (Addendum)
New diagnosis ?Advised starting with Metformin 500 mg bid x for 1-2 week and then increasing by an additional 500 mg weekly, goal is 1000 mg twice a day. ?Take with meals. ?Check fasting BS in mornings  ?F/u 3 mo  ?

## 2021-05-16 NOTE — ED Notes (Signed)
Pads and crash cart applied to pt. Pt given 6mg  Adenosine by this RN and MD Joni Fears rapid IVP ?

## 2021-05-16 NOTE — ED Provider Notes (Signed)
? ?[facility] ?Provider Note ? ? ? Event Date/Time  ? First MD Initiated Contact with Patient 05/16/21 1712   ?  (approximate) ? ? ?History  ? ?SVT ? ? ?HPI ? ?Jerome Graves is a 56 y.o. male with a past history of hypertension and GERD, newly diagnosed with diabetes today by his PCP who is also found to have a blood sugar of 500 and a heart rate of 170s and sent to the ED for evaluation. ? ?Patient reports that he has had polyuria and polydipsia and progressive fatigue for the past couple of months.  4 days ago he got dizzy and fell at home.  He has been drinking lots of fluids. ? ?Denies chest pain or shortness of breath, no current dizziness.  No fever or neck pain. ?  ? ? ?Physical Exam  ? ?Triage Vital Signs: ?ED Triage Vitals  ?Enc Vitals Group  ?   BP 05/16/21 1718 (!) 139/111  ?   Pulse Rate 05/16/21 1718 (!) 168  ?   Resp 05/16/21 1718 (!) 32  ?   Temp 05/16/21 1911 98.4 ?F (36.9 ?C)  ?   Temp Source 05/16/21 1911 Oral  ?   SpO2 05/16/21 1718 100 %  ?   Weight --   ?   Height --   ?   Head Circumference --   ?   Peak Flow --   ?   Pain Score --   ?   Pain Loc --   ?   Pain Edu? --   ?   Excl. in [place]? --   ? ? ?Most recent vital signs: ?Vitals:  ? 05/16/21 1911 05/16/21 2241  ?BP: (!) 151/102 (!) 145/100  ?Pulse: (!) 103 (!) 111  ?Resp: 20 20  ?Temp: 98.4 ?F (36.9 ?C) 98.5 ?F (36.9 ?C)  ?SpO2: 97% 99%  ? ? ? ?General: Awake, no distress.  ?CV:  Good peripheral perfusion.  Tachycardia heart rate 170 ?Resp:  Normal effort.  Clear to auscultation bilaterally ?Abd:  No distention.  Soft and nontender ?Other:  Dry mucous membranes.  No rash.  No wounds.  Chest wall and pelvis stable. ? ? ?ED Results / Procedures / Treatments  ? ?Labs ?(all labs ordered are listed, but only abnormal results are displayed) ?Labs Reviewed  ?COMPREHENSIVE METABOLIC PANEL - Abnormal; Notable for the following components:  ?    Result Value  ? Sodium 131 (*)   ? Chloride 88 (*)   ? Glucose, Bld 496 (*)   ? BUN  37 (*)   ? Creatinine, Ser 1.80 (*)   ? Total Protein 8.2 (*)   ? Total Bilirubin 2.3 (*)   ? GFR, Estimated 44 (*)   ? Anion gap 17 (*)   ? All other components within normal limits  ?CBC WITH DIFFERENTIAL/PLATELET - Abnormal; Notable for the following components:  ? WBC 15.2 (*)   ? Neutro Abs 11.1 (*)   ? Monocytes Absolute 1.2 (*)   ? Abs Immature Granulocytes 0.09 (*)   ? All other components within normal limits  ?URINALYSIS, ROUTINE W REFLEX MICROSCOPIC - Abnormal; Notable for the following components:  ? Color, Urine STRAW (*)   ? APPearance CLEAR (*)   ? Glucose, UA >=500 (*)   ? Ketones, ur 20 (*)   ? All other components within normal limits  ?BETA-HYDROXYBUTYRIC ACID - Abnormal; Notable for the following components:  ? Beta-Hydroxybutyric Acid 2.02 (*)   ?  All other components within normal limits  ?BASIC METABOLIC PANEL - Abnormal; Notable for the following components:  ? Potassium 2.8 (*)   ? Chloride 92 (*)   ? Glucose, Bld 257 (*)   ? BUN 32 (*)   ? Creatinine, Ser 1.51 (*)   ? GFR, Estimated 54 (*)   ? Anion gap 16 (*)   ? All other components within normal limits  ?CBG MONITORING, ED - Abnormal; Notable for the following components:  ? Glucose-Capillary 482 (*)   ? All other components within normal limits  ?CBG MONITORING, ED - Abnormal; Notable for the following components:  ? Glucose-Capillary 347 (*)   ? All other components within normal limits  ?ETHANOL  ?LIPASE, BLOOD  ? ? ? ?EKG ? ?Interpreted by me ?Supraventricular tachycardia, rate of 174.  Left axis, normal intervals.  Normal QRS ST segments and T waves. ? ? ?RADIOLOGY ? ? ? ? ?PROCEDURES: ? ?Critical Care performed: Yes, see critical care procedure note(s) ? ?.Critical Care ?Performed by: Carrie Mew, MD ?Authorized by: Carrie Mew, MD  ? ?Critical care provider statement:  ?  Critical care time (minutes):  35 ?  Critical care time was exclusive of:  Separately billable procedures and treating other patients ?  Critical  care was necessary to treat or prevent imminent or life-threatening deterioration of the following conditions:  Cardiac failure, circulatory failure, metabolic crisis and dehydration ?  Critical care was time spent personally by me on the following activities:  Development of treatment plan with patient or surrogate, discussions with consultants, evaluation of patient's response to treatment, examination of patient, obtaining history from patient or surrogate, ordering and performing treatments and interventions, ordering and review of laboratory studies, ordering and review of radiographic studies, pulse oximetry, re-evaluation of patient's condition and review of old charts ?Comments:  ?    ? ? ?.1-3 Lead EKG Interpretation ?Performed by: Carrie Mew, MD ?Authorized by: Carrie Mew, MD  ? ?  Interpretation: normal   ?  ECG rate:  103 ?  ECG rate assessment: tachycardic   ?  Rhythm: sinus tachycardia   ?  Ectopy: none   ?  Conduction: normal   ?Comments:  ?   Prior to discharge at 23:00 ? ? ?MEDICATIONS ORDERED IN ED: ?Medications  ?sodium chloride 0.9 % bolus 1,000 mL (0 mLs Intravenous Stopped 05/16/21 1923)  ?adenosine (ADENOCARD) 12 MG/4ML injection (  Given 05/16/21 1722)  ?adenosine (ADENOCARD) 6 MG/2ML injection 6 mg (6 mg Intravenous Given 05/16/21 1722)  ?lactated ringers bolus 1,000 mL (0 mLs Intravenous Stopped 05/16/21 2140)  ?insulin aspart (novoLOG) injection 6 Units (6 Units Intravenous Given 05/16/21 2002)  ?metFORMIN (GLUCOPHAGE) tablet 500 mg (500 mg Oral Given 05/16/21 1959)  ?potassium chloride SA (KLOR-CON M) CR tablet 60 mEq (60 mEq Oral Given 05/16/21 2240)  ? ? ? ?IMPRESSION / MDM / ASSESSMENT AND PLAN / ED COURSE  ?I reviewed the triage vital signs and the nursing notes. ?             ?               ? ?Differential diagnosis includes, but is not limited to, dehydration, electrolyte abnormality, anemia, DKA ? ?**The patient is on the cardiac monitor to evaluate for evidence of  arrhythmia and/or significant heart rate changes.**} ? ?Patient presents in SVT with hyperglycemia, clinically dehydrated.  Initial blood pressure is normal.  Patient given IV adenosine 6 mg rapid IV push with immediate conversion  to sinus tachycardia.  Will give IV fluids for hydration, check labs. ? ?Clinical Course as of 05/16/21 2305  ?Fri May 16, 2021  ?[place] show AKI and a mild degree of DKA.  Patient reports feeling great, asymptomatic, much improved after adenosine to convert his SVT back to sinus tachycardia.  Will give additional fluids and IV insulin for glycemic control, repeat chemistry panel to ensure that he is appropriate for outpatient follow-up. [PS]  ?  ?Clinical Course User Index ?[PS] Carrie Mew, MD  ? ? ?----------------------------------------- ?11:10 PM on 05/16/2021 ?----------------------------------------- ?Repeat metabolic panel improved.  Still refusing admission, has medical decision-making capacity and I think outpatient follow-up is reasonable given improved hemodynamics.  Stable for discharge. ? ? ?FINAL CLINICAL IMPRESSION(S) / ED DIAGNOSES  ? ?Final diagnoses:  ?SVT (supraventricular tachycardia) ([place])  ?New onset type 2 diabetes mellitus ([place])  ? ? ? ?Rx / DC Orders  ? ?ED Discharge Orders   ? ?      Ordered  ?  blood glucose meter kit and supplies KIT       ? 05/16/21 2304  ? ?  ?  ? ?  ? ? ? ?Note:  This document was prepared using Dragon voice recognition software and may include unintentional dictation errors. ?  ?Carrie Mew, MD ?05/16/21 2310 ? ?

## 2021-05-16 NOTE — ED Notes (Signed)
Pt BIB POV from PMD office for hyperglycemia and was found to be in SVT on arrival. Pt a/ox4, cool skin, denies CP, SOB, palpitations but has + polyuria, polydipsia x 6 months. HR 170. BGL 482 ?

## 2021-05-16 NOTE — Assessment & Plan Note (Addendum)
Episode with hot tub likely orthostatic syncope . ?Keep chlorthaidione 25 mg daily for one week and then add back losartan 25 mg daily. ?Return to office in 1-2 weeks for BP check. ?Advised we will likely need to titrate up to a higher dose of losartan but d/t syncopal episode will start slowly.  ? ?

## 2021-05-16 NOTE — ED Triage Notes (Signed)
Pt to ED via POV from PCP. Pt states he has been having polydipsia and polyuria x44months. Pt seen at PCP today and dx with DM, CBG 560. PCP did EKG and showed HR in 170s. Upon arrival pt CBG 482 and HR showing SVT in 170s. Pt c/o left rib pain from fall out of hot tub Monday.  ?

## 2021-05-16 NOTE — Assessment & Plan Note (Addendum)
Starting atorvastatin 20 mg. Explained risks of heart attack/stroke ?The 10-year ASCVD risk score (Arnett DK, et al., 2019) is: 29.6% ?Recheck lipids 58mo  ?May need fish oil as well d/t elevated triglycerides ?

## 2021-05-16 NOTE — Progress Notes (Signed)
?  ?I,Jerome Graves,acting as a scribe for [employer], PA-C.,have documented all relevant documentation on the behalf of Jerome Kirschner, PA-C,as directed by  Jerome Kirschner, PA-C while in the presence of Jerome Kirschner, PA-C. ? ? ?Established patient visit ? ? ?Patient: Jerome Graves   DOB: [birth year]   56 y.o. Male  MRN: [id number] ?Visit Date: 05/16/2021 ? ?Today's healthcare provider: Mikey Kirschner, PA-C  ? ?Chief Complaint  ?Patient presents with  ? Diabetes  ? Hyperlipidemia  ? ?Subjective  ?  ?HPI  ? ?Jerome Graves is a 56 y/o male who presents today for follow up of recent bloodwork. ?He reports restarting Chlorthiadone 25 mg and losartan 100 last week, had been feeling low BP, on Monday was in a hot tub and felt very lightheaded--fell and LOC as he was getting out, heard a 'crunch' on his left side ribs. Left sided chest pain since then. ? ?He reports when he was walking up the steps to this appointment, felt chest tightness, SOB, sweaty, and this did not go away sitting down.  ? ?In regards to recent bloodwork, he reports never knowing he was diabetic, had started checking is fasting BS this past week, all over 300. Reports frequent urination, thirst.  ? ?Diabetes Mellitus Type II, Follow-up ? ?Lab Results  ?Component Value Date  ? HGBA1C 11.3 (H) 05/15/2021  ? ?Wt Readings from Last 3 Encounters:  ?05/06/21 242 lb (109.8 kg)  ?04/29/21 252 lb (114.3 kg)  ?04/26/21 245 lb (111.1 kg)  ? ?Last seen for diabetes 1 weeks ago.  ?Symptoms: ?Yes fatigue No foot ulcerations  ?No appetite changes No nausea  ?No paresthesia of the feet  No polydipsia  ?Yes polyuria Yes visual disturbances   ?No vomiting   ? ? ?Most Recent Eye Exam: Needs updated ?Current exercise: none ?Current diet habits: well balanced ? ?Pertinent Labs: ?Lab Results  ?Component Value Date  ? CHOL 298 (H) 05/15/2021  ? HDL 36 (L) 05/15/2021  ? [place] Comment (A) 05/15/2021  ? TRIG 1,074 ([place]) 05/15/2021  ? CHOLHDL 8.3 (H) 05/15/2021  ? Lab Results   ?Component Value Date  ? NA 136 05/16/2021  ? K 2.8 (L) 05/16/2021  ? CREATININE 1.51 (H) 05/16/2021  ? EGFR 48 (L) 05/15/2021  ? LABMICR See below: 04/29/2021  ?  ? ?---------------------------------------------------------------------------------------------------  ?Lipid/Cholesterol, Follow-up ? ?Last lipid panel Other pertinent labs  ?Lab Results  ?Component Value Date  ? CHOL 298 (H) 05/15/2021  ? HDL 36 (L) 05/15/2021  ? [place] Comment (A) 05/15/2021  ? TRIG 1,074 ([place]) 05/15/2021  ? CHOLHDL 8.3 (H) 05/15/2021  ? Lab Results  ?Component Value Date  ? ALT 15 05/16/2021  ? AST 17 05/16/2021  ? PLT 344 05/16/2021  ?  ? ?He was last seen for this 1 weeks ago.  ? ?Symptoms: ?No chest pain No chest pressure/discomfort  ?No dyspnea No lower extremity edema  ?No numbness or tingling of extremity No orthopnea  ?No palpitations No paroxysmal nocturnal dyspnea  ?No speech difficulty No syncope  ? ?The 10-year ASCVD risk score (Arnett DK, et al., 2019) is: 29.6% ? ?--------------------------------------------------------------------------------------------------- ? ? ?Medications: ?Outpatient Medications Prior to Visit  ?Medication Sig  ? ibuprofen (ADVIL) 600 MG tablet Take 1 tablet (600 mg total) by mouth every 6 (six) hours as needed.  ? [DISCONTINUED] losartan (COZAAR) 100 MG tablet Take 1 tablet (100 mg total) by mouth daily.  ? albuterol (VENTOLIN HFA) 108 (90 Base) MCG/ACT inhaler Inhale 2 puffs into the lungs  every 6 (six) hours as needed for wheezing or shortness of breath. (Patient not taking: Reported on 05/16/2021)  ? chlorthalidone (HYGROTON) 25 MG tablet Take 1 tablet (25 mg total) by mouth daily. (Patient not taking: Reported on 05/16/2021)  ? [DISCONTINUED] cephALEXin (KEFLEX) 500 MG capsule Take 1 capsule (500 mg total) by mouth daily. (Patient not taking: Reported on 05/16/2021)  ? [DISCONTINUED] ondansetron (ZOFRAN-ODT) 8 MG disintegrating tablet Take 1 tablet (8 mg total) by mouth every 8 (eight) hours  as needed for nausea or vomiting. (Patient not taking: Reported on 05/16/2021)  ? [DISCONTINUED] oxybutynin (DITROPAN XL) 10 MG 24 hr tablet Take 1 tablet (10 mg total) by mouth daily as needed (bladder spasms/bladder pain). (Patient not taking: Reported on 05/16/2021)  ? [DISCONTINUED] valACYclovir (VALTREX) 1000 MG tablet Take 1 tablet (1,000 mg total) by mouth 2 (two) times daily. (Patient not taking: Reported on 05/16/2021)  ? ?No facility-administered medications prior to visit.  ? ? ?Review of Systems  ?Constitutional:  Positive for diaphoresis and fatigue. Negative for fever.  ?Respiratory:  Positive for shortness of breath. Negative for cough.   ?Cardiovascular:  Positive for chest pain. Negative for palpitations and leg swelling.  ?Neurological:  Positive for light-headedness. Negative for dizziness and headaches.  ?  Objective  ?  ?Blood pressure (!) 129/107, pulse (!) 173, temperature (!) 97.5 ?F (36.4 ?C), temperature source Oral, height 6' (1.829 m), SpO2 98 %.  ? ?Physical Exam ?Constitutional:   ?   General: He is awake.  ?   Appearance: He is well-developed. He is ill-appearing and diaphoretic.  ?HENT:  ?   Head: Normocephalic.  ?Eyes:  ?   Conjunctiva/sclera: Conjunctivae normal.  ?Cardiovascular:  ?   Rate and Rhythm: Regular rhythm. Tachycardia present.  ?   Heart sounds: Normal heart sounds. No murmur heard. ?Pulmonary:  ?   Effort: Pulmonary effort is normal.  ?   Breath sounds: Normal breath sounds.  ?Skin: ?   General: Skin is warm.  ?   Coloration: Skin is pale.  ?Neurological:  ?   Mental Status: He is alert and oriented to person, place, and time.  ?Psychiatric:     ?   Attention and Perception: Attention normal.     ?   Mood and Affect: Mood normal.     ?   Speech: Speech normal.     ?   Behavior: Behavior is cooperative.  ?  ? ?Results for orders placed or performed during the hospital encounter of 05/16/21  ?Comprehensive metabolic panel  ?Result Value Ref Range  ? Sodium 131 (L) 135 - 145  mmol/L  ? Potassium 3.5 3.5 - 5.1 mmol/L  ? Chloride 88 (L) 98 - 111 mmol/L  ? CO2 26 22 - 32 mmol/L  ? Glucose, Bld 496 (H) 70 - 99 mg/dL  ? BUN 37 (H) 6 - 20 mg/dL  ? Creatinine, Ser 1.80 (H) 0.61 - 1.24 mg/dL  ? Calcium 9.6 8.9 - 10.3 mg/dL  ? Total Protein 8.2 (H) 6.5 - 8.1 g/dL  ? Albumin 4.3 3.5 - 5.0 g/dL  ? AST 17 15 - 41 U/L  ? ALT 15 0 - 44 U/L  ? Alkaline Phosphatase 102 38 - 126 U/L  ? Total Bilirubin 2.3 (H) 0.3 - 1.2 mg/dL  ? GFR, Estimated 44 (L) >60 mL/min  ? Anion gap 17 (H) 5 - 15  ?Ethanol  ?Result Value Ref Range  ? Alcohol, Ethyl (B) <10 <10 mg/dL  ?CBC with  Differential  ?Result Value Ref Range  ? WBC 15.2 (H) 4.0 - 10.5 K/uL  ? RBC 5.67 4.22 - 5.81 MIL/uL  ? Hemoglobin 16.8 13.0 - 17.0 g/dL  ? HCT 48.7 39.0 - 52.0 %  ? MCV 85.9 80.0 - 100.0 fL  ? MCH 29.6 26.0 - 34.0 pg  ? MCHC 34.5 30.0 - 36.0 g/dL  ? RDW 11.7 11.5 - 15.5 %  ? Platelets 344 150 - 400 K/uL  ? nRBC 0.0 0.0 - 0.2 %  ? Neutrophils Relative % 71 %  ? Neutro Abs 11.1 (H) 1.7 - 7.7 K/uL  ? Lymphocytes Relative 17 %  ? Lymphs Abs 2.5 0.7 - 4.0 K/uL  ? Monocytes Relative 8 %  ? Monocytes Absolute 1.2 (H) 0.1 - 1.0 K/uL  ? Eosinophils Relative 2 %  ? Eosinophils Absolute 0.2 0.0 - 0.5 K/uL  ? Basophils Relative 1 %  ? Basophils Absolute 0.1 0.0 - 0.1 K/uL  ? Immature Granulocytes 1 %  ? Abs Immature Granulocytes 0.09 (H) 0.00 - 0.07 K/uL  ?Lipase, blood  ?Result Value Ref Range  ? Lipase 49 11 - 51 U/L  ?Urinalysis, Routine w reflex microscopic Urine, Clean Catch  ?Result Value Ref Range  ? Color, Urine STRAW (A) YELLOW  ? APPearance CLEAR (A) CLEAR  ? Specific Gravity, Urine 1.021 1.005 - 1.030  ? pH 5.0 5.0 - 8.0  ? Glucose, UA >=500 (A) NEGATIVE mg/dL  ? Hgb urine dipstick NEGATIVE NEGATIVE  ? Bilirubin Urine NEGATIVE NEGATIVE  ? Ketones, ur 20 (A) NEGATIVE mg/dL  ? Protein, ur NEGATIVE NEGATIVE mg/dL  ? Nitrite NEGATIVE NEGATIVE  ? Leukocytes,Ua NEGATIVE NEGATIVE  ? RBC / HPF 0-5 0 - 5 RBC/hpf  ? WBC, UA 0-5 0 - 5 WBC/hpf  ?  Bacteria, UA NONE SEEN NONE SEEN  ? Squamous Epithelial / LPF NONE SEEN 0 - 5  ?Beta-hydroxybutyric acid  ?Result Value Ref Range  ? Beta-Hydroxybutyric Acid 2.02 (H) 0.05 - 0.27 mmol/L  ?Basic metabolic pa

## 2021-05-16 NOTE — Telephone Encounter (Signed)
While talking with his wife (She had called in to schedule an appt after seeing his results on MyChart) we got disconnected.    I called back to [facility] number and he answered.   I made him an appt at this point to see Alfredia Ferguson, PA-C at 3:40 today at his wife's request to see her. ? ?I called pt back after seeing his lab result message from Alfredia Ferguson dated 05/16/2021 at 8:39 AM.   I read it to him and encouraged him to keep the appt today with Lillia Abed if at all possible.   He had mentioned earlier he may not be able to come in today due to work.   He said he would be able to make the appt today.    ? ? ?

## 2021-05-16 NOTE — ED Notes (Signed)
EDP at bedside. Code cart at bedside. Obtaining IV access.  ?

## 2021-05-16 NOTE — Telephone Encounter (Signed)
Copied from CRM [id number]. Topic: General - Other ?>> May 16, 2021  8:16 AM Jaquita Rector A wrote: ?Reason for CRM: Patient wife Shona Simpson called in asking if Alfredia Ferguson can please review patients labs from yesterday and give her a call because she stated that the numbers are high from what she seen on My Chart and he had some issues with his blood sugar she did not elaborate just asked to speak with provider. Please call Ph# [phone] ?

## 2021-05-16 NOTE — ED Notes (Signed)
Pt converts back to ST 117BPM, reports verbal relief of symptoms. 12 lead captured ?

## 2021-05-16 NOTE — Assessment & Plan Note (Addendum)
EKG in office demonstrates SVT w/ one PVC. ?HR 173 in office ?Pt became progressively weaker, diaphoretic, pale in office. ?Clutching left side of chest -- per pt his 'broken rib'.  ?Dispo ED, confirmed w/ MD in office Dr Sherrie Mustache.  ?

## 2021-05-16 NOTE — Telephone Encounter (Signed)
Reason for Disposition ? New-onset diabetes suspected (e.g., frequent urination, weakness, weight loss) ? ?Answer Assessment - Initial Assessment Questions ?1. BLOOD GLUCOSE: "What is your blood glucose level?"  ?    Wife calling in.   His glucose was 318 this morning fasting.   Meter High.  A1C 11 on lab tests.   Wife concerned about sugar this week.   He was seen last week.   I brought this up this during appt. Last week.  He had labs done and the MyChart results are elevated.   I want to get an appt ASAP.  I'm concerned about him and the glucose being high.     ?I don't want to see Jacquelin Hawking, PA again.   ?2. ONSET: "When did you check the blood glucose?" ?    This morning ?3. USUAL RANGE: "What is your glucose level usually?" (e.g., usual fasting morning value, usual evening value) ?    *No Answer*318.    ?4. KETONES: "Do you check for ketones (urine or blood test strips)?" If yes, ask: "What does the test show now?"  ?    *No Answer* ?5. TYPE 1 or 2:  "Do you know what type of diabetes you have?"  (e.g., Type 1, Type 2, Gestational; doesn't know)  ?    Never been diagnosed with diabetes before. ?6. INSULIN: "Do you take insulin?" "What type of insulin(s) do you use? What is the mode of delivery? (syringe, pen; injection or pump)?"  ?    *No Answer* ?7. DIABETES PILLS: "Do you take any pills for your diabetes?" If yes, ask: "Have you missed taking any pills recently?" ?    *No Answer* ?8. OTHER SYMPTOMS: "Do you have any symptoms?" (e.g., fever, frequent urination, difficulty breathing, dizziness, weakness, vomiting) ?    *No Answer* ?9. PREGNANCY: "Is there any chance you are pregnant?" "When was your last menstrual period?" ?    *No Answer* ? ?Protocols used: Diabetes - High Blood Sugar-A-AH ? ?Chief Complaint: elevated glucose 318 this morning fasting and on MyChart labs from yesterday A1C 11.  Wife requesting an appt ASAP because he is not diabetic that he knows of. ?Symptoms: He passed out one time last  week.   Wife calling in concerned about MyChart lab results done yesterday. ?Frequency: One day his glucose meter read "high" which means his glucose is over 600. ?Pertinent Negatives: Patient denies Wife not with husband during call. ?Disposition: [] ED /[] Urgent Care (no appt availability in office) / [x] Appointment(In office/virtual)/ []  [place] [facility]/ [] Home Care/ [] Refused Recommended Disposition /[] [place] [facility]/ []  Follow-up with PCP ?Additional Notes: Appt made after calling husband (I was talking with wife and we got disconnected).   I scheduled him for today at 3:40 with , PA-C.   He may need to change that appt after talking with his wife.   He will have her call back  if necessary. ?

## 2021-05-19 DIAGNOSIS — R0789 Other chest pain: Secondary | ICD-10-CM | POA: Insufficient documentation

## 2021-05-19 MED ORDER — LOSARTAN POTASSIUM 25 MG PO TABS: 25.0000 mg | ORAL_TABLET | Freq: Every day | ORAL | 1 refills | Status: DC

## 2021-05-19 NOTE — Assessment & Plan Note (Signed)
Likely secondary to possible rib fracture, s/p fall. ?Pt declined chest xray. ?No visible bruising or chest deformity ?

## 2021-05-19 NOTE — Patient Instructions (Signed)
start metformin 500 mg twice daily, with food (breakfast/dinner). After two weeks, increase the dose by 500 mg a day, so after a week hell be taking 2 pills in AM and 1 in PM.  ?After another week, increase to the goal dose of 1000 mg twice daily.  (2 pills in AM and 2 pills in PM). ? ?continue chlorthalidone 25 mg a day for a week, and then add losartan 25 mg daily.  ?In 2-3 weeks, come into the office for a BP check and review meds. ? ?atorvastatin 20 mg daily. Doesn't matter what time of day. ? ?check BP once or twice a day, best time is first thing in AM or right before bed. ? ?Check BS once daily in AM - -fasting.  ? ?Follow up in office in 3 mo or earlier if any questions/concerns.  ?

## 2021-05-19 NOTE — Assessment & Plan Note (Addendum)
+   glucose and ketones ?Suspicion for DKA as well, pt is oriented, but vitals abnormal -- and see SVT note ?

## 2021-05-28 ENCOUNTER — Encounter: Payer: Self-pay | Admitting: Physician Assistant

## 2021-05-28 ENCOUNTER — Ambulatory Visit: Payer: BC Managed Care – PPO | Admitting: Urology

## 2021-07-03 ENCOUNTER — Ambulatory Visit: Payer: BC Managed Care – PPO | Admitting: Urology

## 2021-07-03 ENCOUNTER — Encounter: Payer: Self-pay | Admitting: Urology

## 2021-07-05 ENCOUNTER — Encounter: Payer: Self-pay | Admitting: Physician Assistant

## 2021-07-06 ENCOUNTER — Encounter: Payer: Self-pay | Admitting: Physician Assistant

## 2021-07-09 ENCOUNTER — Other Ambulatory Visit: Payer: Self-pay | Admitting: Physician Assistant

## 2021-07-09 ENCOUNTER — Telehealth: Payer: Self-pay

## 2021-07-09 DIAGNOSIS — E1165 Type 2 diabetes mellitus with hyperglycemia: Secondary | ICD-10-CM

## 2021-07-09 DIAGNOSIS — I10 Essential (primary) hypertension: Secondary | ICD-10-CM

## 2021-07-09 MED ORDER — LOSARTAN POTASSIUM 25 MG PO TABS: 25.0000 mg | ORAL_TABLET | Freq: Every day | ORAL | 1 refills | Status: DC

## 2021-07-09 MED ORDER — METFORMIN HCL 500 MG PO TABS: 1000.0000 mg | ORAL_TABLET | Freq: Two times a day (BID) | ORAL | 1 refills | Status: DC

## 2021-07-09 NOTE — Telephone Encounter (Signed)
Copied from [place] [id number]. Topic: General - Other ?>> Jul 09, 2021  9:46 AM Valere Dross wrote: ?Reason for CRM: Pts wife called in about the medication situation *see mychart messages encounter* and requested if someone can just give a call so they can explain everything, because pt is really needing the medication and with the dosage change, he ran out sooner that is written,please advise. ?

## 2021-07-10 ENCOUNTER — Other Ambulatory Visit: Payer: Self-pay | Admitting: Physician Assistant

## 2021-07-10 DIAGNOSIS — I10 Essential (primary) hypertension: Secondary | ICD-10-CM

## 2021-07-10 MED ORDER — CHLORTHALIDONE 25 MG PO TABS: 25.0000 mg | ORAL_TABLET | Freq: Every day | ORAL | 1 refills | Status: DC

## 2021-08-06 NOTE — Progress Notes (Signed)
Jerome Graves,acting as a scribe for Mikey Kirschner, PA-C.,have documented all relevant documentation on the behalf of Mikey Kirschner, PA-C,as directed by  Mikey Kirschner, PA-C while in the presence of Mikey Kirschner, PA-C.  Established patient visit   Patient: Jerome Graves   DOB: 07-15-1965   56 y.o. Male  MRN: [id number] Visit Date: 08/07/2021  Today's healthcare provider: Mikey Kirschner, PA-C   Cc. HTN, DMII, HLD f/u  Subjective    HPI  Diabetes Mellitus Type II, follow-up  Lab Results  Component Value Date   HGBA1C 6.3 (A) 08/07/2021   HGBA1C 11.3 (H) 05/15/2021   Last seen for diabetes 11 weeks ago.  Management since then includes starting with Metformin 500 mg bid x for 1-2 week and then increasing by an additional 500 mg weekly, goal is 1000 mg twice a day. Take with meals. Check fasting BS in mornings  He reports good compliance with treatment. He is not having side effects.   Home blood sugar records:  110-150  Episodes of hypoglycemia? No    Current insulin regiment: none Most Recent Eye Exam: Patient is due; does not have eye doc  --------------------------------------------------------------------------------------------------- Hypertension, follow-up  BP Readings from Last 3 Encounters:  08/07/21 (!) 154/98  05/16/21 (!) 145/100  05/16/21 (!) 129/107   Wt Readings from Last 3 Encounters:  08/07/21 237 lb (107.5 kg)  05/06/21 242 lb (109.8 kg)  04/29/21 252 lb (114.3 kg)     He was last seen for hypertension 11 weeks ago.  BP at that visit was 129/107. Management since that visit includes keep chlorthaidione 25 mg daily for one week and then add back losartan 100 mg daily. He reports good compliance with treatment. He is not having side effects.   He does not smoke.  Use of agents associated with hypertension: none.   NOTE: Patient states his blood pressure may be elevates today as he got very intoxicated last  night --------------------------------------------------------------------------------------------------- Lipid/Cholesterol, follow-up  Last Lipid Panel: Lab Results  Component Value Date   CHOL 298 (H) 05/15/2021   LDLCALC Comment (A) 05/15/2021   HDL 36 (L) 05/15/2021   TRIG 1,074 ([place]) 05/15/2021    He was last seen for this 11 weeks ago.  Management since that visit includes starting atorvastatin 20 mg. May need fish oil as well d/t elevated triglycerides  He reports good compliance with treatment. He is not having side effects.   Symptoms: No appetite changes No foot ulcerations  No chest pain No chest pressure/discomfort  No dyspnea No orthopnea  No fatigue No lower extremity edema  No palpitations No paroxysmal nocturnal dyspnea  No nausea No numbness or tingling of extremity  No polydipsia No polyuria  No speech difficulty No syncope   [id number]  Last metabolic panel Lab Results  Component Value Date   GLUCOSE 257 (H) 05/16/2021   NA 136 05/16/2021   K 2.8 (L) 05/16/2021   BUN 32 (H) 05/16/2021   CREATININE 1.51 (H) 05/16/2021   EGFR 48 (L) 05/15/2021   GFRNONAA 54 (L) 05/16/2021   CALCIUM 9.2 05/16/2021   AST 17 05/16/2021   ALT 15 05/16/2021   The 10-year ASCVD risk score (Arnett DK, et al., 2019) is: 32.4%  ---------------------------------------------------------------------------------------------------   Medications: Outpatient Medications Prior to Visit  Medication Sig   atorvastatin (LIPITOR) 20 MG tablet Take 1 tablet (20 mg total) by mouth daily.   blood glucose meter kit and supplies KIT Dispense based on patient and insurance preference. Use  up to four times daily as directed.   chlorthalidone (HYGROTON) 25 MG tablet Take 1 tablet (25 mg total) by mouth daily.   ibuprofen (ADVIL) 600 MG tablet Take 1 tablet (600 mg total) by mouth every 6 (six) hours as needed.   metFORMIN (GLUCOPHAGE) 500 MG tablet Take 2 tablets (1,000 mg total) by mouth 2  (two) times daily with a meal.   [DISCONTINUED] losartan (COZAAR) 25 MG tablet Take 1 tablet (25 mg total) by mouth daily.   albuterol (VENTOLIN HFA) 108 (90 Base) MCG/ACT inhaler Inhale 2 puffs into the lungs every 6 (six) hours as needed for wheezing or shortness of breath.   No facility-administered medications prior to visit.    Review of Systems  Constitutional:  Negative for fatigue and fever.  Respiratory:  Negative for cough and shortness of breath.   Cardiovascular:  Negative for chest pain, palpitations and leg swelling.  Neurological:  Negative for dizziness and headaches.       Objective    BP (!) 154/98 (BP Location: Left Arm, Cuff Size: Normal)   Pulse (!) 110   Temp 98.2 F (36.8 C) (Oral)   Wt 237 lb (107.5 kg)   SpO2 100%   BMI 32.14 kg/m  Vitals:   08/07/21 0813 08/07/21 0818  BP: (!) 171/107 (!) 154/98  Pulse: (!) 110   Temp: 98.2 F (36.8 C)   TempSrc: Oral   SpO2: 100%   Weight: 237 lb (107.5 kg)      Physical Exam Constitutional:      General: He is awake.     Appearance: He is well-developed.  HENT:     Head: Normocephalic.  Eyes:     Conjunctiva/sclera: Conjunctivae normal.  Cardiovascular:     Rate and Rhythm: Normal rate and regular rhythm.     Pulses:          Dorsalis pedis pulses are 3+ on the right side and 3+ on the left side.     Heart sounds: Normal heart sounds.  Pulmonary:     Effort: Pulmonary effort is normal.     Breath sounds: Normal breath sounds.  Feet:     Right foot:     Protective Sensation: 3 sites tested.  3 sites sensed.     Skin integrity: Skin integrity normal.     Toenail Condition: Right toenails are normal.     Left foot:     Protective Sensation: 3 sites tested.  3 sites sensed.     Skin integrity: Skin integrity normal.     Toenail Condition: Left toenails are normal.  Skin:    General: Skin is warm.  Neurological:     Mental Status: He is alert and oriented to person, place, and time.   Psychiatric:        Attention and Perception: Attention normal.        Mood and Affect: Mood normal.        Speech: Speech normal.        Behavior: Behavior is cooperative.     Results for orders placed or performed in visit on 08/07/21  POCT HgB A1C  Result Value Ref Range   Hemoglobin A1C 6.3 (A) 4.0 - 5.6 %   HbA1c POC (<> result, manual entry)     HbA1c, POC (prediabetic range)     HbA1c, POC (controlled diabetic range)      Assessment & Plan     Problem List Items Addressed This Visit  Cardiovascular and Mediastinum   Hypertension associated with diabetes ([place])    Elevated in office and at home. Sent in new rx of losartan 100 mg -- he was taking 25 mg x 4 pills daily.  Continue chlorthalidone 25 mg Adding amlodipine 5 mg. Will check cmp today      Relevant Medications   losartan (COZAAR) 100 MG tablet   amLODipine (NORVASC) 5 MG tablet   Other Relevant Orders   Comprehensive Metabolic Panel (CMET)   SVT (supraventricular tachycardia) (HCC)    One episode treated 3/23 after appointment here in ED . Pt denies palpitations or continued symptoms. Will continue to monitor, pt aware of triggers and potential symptoms      Relevant Medications   losartan (COZAAR) 100 MG tablet   amLODipine (NORVASC) 5 MG tablet     Endocrine   Type 2 diabetes mellitus with hyperglycemia, without long-term current use of insulin (HCC) - Primary    A1c at goal now 6.3% with only metformin Happy with this; continue metformin 1000 mg BID, pt denies SE.  uacr checked today On lipitor 20 mg -- but see HLD note.  Foot exam completed today Referred to optho F/u 30mo     Relevant Medications   losartan (COZAAR) 100 MG tablet   glucose blood ([facility] GLUCOSE METER TEST STRIPS) test strip   Other Relevant Orders   Urine Microalbumin w/creat. ratio   POCT HgB A1C (Completed)   Ambulatory referral to Ophthalmology   Hyperlipidemia associated with type 2 diabetes mellitus ([place]     Last assessment unable to ID LDL d/t elevated trigs. Will repeat fasting lipid panel today Currently managed with lipitor 20 mg advised pt may increase dose as our LDL goal is < 70      Relevant Medications   losartan (COZAAR) 100 MG tablet   amLODipine (NORVASC) 5 MG tablet   Other Relevant Orders   Comprehensive Metabolic Panel (CMET)   Lipid Profile     Return in about 3 months (around 11/07/2021) for DMII, hypertension.      I, LMikey Kirschner PA-C have reviewed all documentation for this visit. The documentation on  08/07/2021  for the exam, diagnosis, procedures, and orders are all accurate and complete.  LMikey Kirschner PA-C [facility] [place] [address]Office: [phone]: 3[place]

## 2021-08-07 ENCOUNTER — Ambulatory Visit (INDEPENDENT_AMBULATORY_CARE_PROVIDER_SITE_OTHER): Payer: BC Managed Care – PPO | Admitting: Physician Assistant

## 2021-08-07 ENCOUNTER — Encounter: Payer: Self-pay | Admitting: Physician Assistant

## 2021-08-07 VITALS — BP 154/98 | HR 110 | Temp 98.2°F | Wt 237.0 lb

## 2021-08-07 DIAGNOSIS — I471 Supraventricular tachycardia, unspecified: Secondary | ICD-10-CM

## 2021-08-07 DIAGNOSIS — E1159 Type 2 diabetes mellitus with other circulatory complications: Secondary | ICD-10-CM | POA: Diagnosis not present

## 2021-08-07 DIAGNOSIS — E1169 Type 2 diabetes mellitus with other specified complication: Secondary | ICD-10-CM | POA: Diagnosis not present

## 2021-08-07 DIAGNOSIS — E785 Hyperlipidemia, unspecified: Secondary | ICD-10-CM

## 2021-08-07 DIAGNOSIS — E1165 Type 2 diabetes mellitus with hyperglycemia: Secondary | ICD-10-CM

## 2021-08-07 DIAGNOSIS — I152 Hypertension secondary to endocrine disorders: Secondary | ICD-10-CM

## 2021-08-07 LAB — POCT GLYCOSYLATED HEMOGLOBIN (HGB A1C): Hemoglobin A1C: 6.3 % — AB (ref 4.0–5.6)

## 2021-08-07 MED ORDER — CVS GLUCOSE METER TEST STRIPS VI STRP: ORAL_STRIP | 3 refills | Status: AC

## 2021-08-07 MED ORDER — AMLODIPINE BESYLATE 5 MG PO TABS: 5.0000 mg | ORAL_TABLET | Freq: Every day | ORAL | 1 refills | Status: DC

## 2021-08-07 MED ORDER — LOSARTAN POTASSIUM 100 MG PO TABS: 100.0000 mg | ORAL_TABLET | Freq: Every day | ORAL | 1 refills | Status: DC

## 2021-08-07 NOTE — Assessment & Plan Note (Signed)
One episode treated 3/23 after appointment here in ED . Pt denies palpitations or continued symptoms. Will continue to monitor, pt aware of triggers and potential symptoms

## 2021-08-07 NOTE — Assessment & Plan Note (Signed)
Elevated in office and at home. Sent in new rx of losartan 100 mg -- he was taking 25 mg x 4 pills daily.  Continue chlorthalidone 25 mg Adding amlodipine 5 mg. Will check cmp today

## 2021-08-07 NOTE — Assessment & Plan Note (Signed)
Last assessment unable to ID LDL d/t elevated trigs. Will repeat fasting lipid panel today Currently managed with lipitor 20 mg advised pt may increase dose as our LDL goal is < 70

## 2021-08-07 NOTE — Assessment & Plan Note (Signed)
A1c at goal now 6.3% with only metformin Happy with this; continue metformin 1000 mg BID, pt denies SE.  uacr checked today On lipitor 20 mg -- but see HLD note.  Foot exam completed today Referred to optho F/u 24mo

## 2021-08-08 LAB — COMPREHENSIVE METABOLIC PANEL
ALT: 31 IU/L (ref 0–44)
AST: 22 IU/L (ref 0–40)
Albumin/Globulin Ratio: 1.9 (ref 1.2–2.2)
Albumin: 4.8 g/dL (ref 3.8–4.9)
Alkaline Phosphatase: 87 IU/L (ref 44–121)
BUN/Creatinine Ratio: 18 (ref 9–20)
BUN: 22 mg/dL (ref 6–24)
Bilirubin Total: 1.5 mg/dL — ABNORMAL HIGH (ref 0.0–1.2)
CO2: 21 mmol/L (ref 20–29)
Calcium: 10.1 mg/dL (ref 8.7–10.2)
Chloride: 105 mmol/L (ref 96–106)
Creatinine, Ser: 1.19 mg/dL (ref 0.76–1.27)
Globulin, Total: 2.5 g/dL (ref 1.5–4.5)
Glucose: 117 mg/dL — ABNORMAL HIGH (ref 70–99)
Potassium: 4.1 mmol/L (ref 3.5–5.2)
Sodium: 143 mmol/L (ref 134–144)
Total Protein: 7.3 g/dL (ref 6.0–8.5)
eGFR: 72 mL/min/{1.73_m2} (ref >59)

## 2021-08-08 LAB — MICROALBUMIN / CREATININE URINE RATIO
Creatinine, Urine: 112.5 mg/dL
Microalb/Creat Ratio: 23 mg/g creat (ref 0–29)
Microalbumin, Urine: 25.5 ug/mL

## 2021-08-08 LAB — LIPID PANEL
Chol/HDL Ratio: 2.6 ratio (ref 0.0–5.0)
Cholesterol, Total: 132 mg/dL (ref 100–199)
HDL: 50 mg/dL (ref >39)
LDL Chol Calc (NIH): 51 mg/dL (ref 0–99)
Triglycerides: 187 mg/dL — ABNORMAL HIGH (ref 0–149)
VLDL Cholesterol Cal: 31 mg/dL (ref 5–40)

## 2021-11-07 ENCOUNTER — Ambulatory Visit: Payer: BC Managed Care – PPO | Admitting: Physician Assistant

## 2021-11-12 ENCOUNTER — Ambulatory Visit (INDEPENDENT_AMBULATORY_CARE_PROVIDER_SITE_OTHER): Payer: BC Managed Care – PPO | Admitting: Physician Assistant

## 2021-11-12 ENCOUNTER — Encounter: Payer: Self-pay | Admitting: Physician Assistant

## 2021-11-12 VITALS — BP 143/99 | HR 111 | Ht 72.0 in | Wt 242.0 lb

## 2021-11-12 DIAGNOSIS — I152 Hypertension secondary to endocrine disorders: Secondary | ICD-10-CM | POA: Diagnosis not present

## 2021-11-12 DIAGNOSIS — E119 Type 2 diabetes mellitus without complications: Secondary | ICD-10-CM

## 2021-11-12 DIAGNOSIS — E1159 Type 2 diabetes mellitus with other circulatory complications: Secondary | ICD-10-CM | POA: Diagnosis not present

## 2021-11-12 DIAGNOSIS — Z23 Encounter for immunization: Secondary | ICD-10-CM

## 2021-11-12 LAB — POCT GLYCOSYLATED HEMOGLOBIN (HGB A1C): Hemoglobin A1C: 6.3 % — AB (ref 4.0–5.6)

## 2021-11-12 NOTE — Progress Notes (Signed)
I,Sha'taria Tyson,acting as a Education administrator for [employer], PA-C.,have documented all relevant documentation on the behalf of Mikey Kirschner, PA-C,as directed by  Mikey Kirschner, PA-C while in the presence of Mikey Kirschner, PA-C.   Established patient visit   Patient: Jerome Graves   DOB: 1965/07/14   56 y.o. Male  MRN: [id number] Visit Date: 11/12/2021  Today's healthcare provider: Mikey Kirschner, PA-C   Cc. Dm II , htn f/u  Subjective    HPI   Diabetes Mellitus Type II, Follow-up  Lab Results  Component Value Date   HGBA1C 6.3 (A) 11/12/2021   HGBA1C 6.3 (A) 08/07/2021   HGBA1C 11.3 (H) 05/15/2021   Wt Readings from Last 3 Encounters:  11/12/21 242 lb (109.8 kg)  08/07/21 237 lb (107.5 kg)  05/06/21 242 lb (109.8 kg)   Last seen for diabetes 3 months ago.  Management since then includes  continue metformin 1000 mg BID. He reports excellent compliance with treatment. He is not having side effects.  Symptoms: No fatigue No foot ulcerations  No appetite changes No nausea  No paresthesia of the feet  No polydipsia  No polyuria No visual disturbances   No vomiting     Home blood sugar records: fasting range: 140-160  Episodes of hypoglycemia? No    Current insulin regiment: none Most Recent Eye Exam: never done Current exercise: strenuous job Current diet habits: well balanced  Pertinent Labs: Lab Results  Component Value Date   CHOL 132 08/07/2021   HDL 50 08/07/2021   LDLCALC 51 08/07/2021   TRIG 187 (H) 08/07/2021   CHOLHDL 2.6 08/07/2021   Lab Results  Component Value Date   NA 143 08/07/2021   K 4.1 08/07/2021   CREATININE 1.19 08/07/2021   EGFR 72 08/07/2021   LABMICR 25.5 08/07/2021     ---------------------------------------------------------------------------------------------------  Hypertension, follow-up  BP Readings from Last 3 Encounters:  11/12/21 (!) 156/100  08/07/21 (!) 154/98  05/16/21 (!) 145/100   Wt Readings from Last  3 Encounters:  11/12/21 242 lb (109.8 kg)  08/07/21 237 lb (107.5 kg)  05/06/21 242 lb (109.8 kg)     He was last seen for hypertension 3 months ago.  BP at that visit was 154/98. Management since that visit includes sent in new rx of losartan 100 mg -- he was taking 25 mg x 4 pills daily.  Continue chlorthalidone 25 mg Adding amlodipine 5 mg.  He reports excellent compliance with treatment. He is not having side effects.  He is following a Regular diet. He is not exercising. He does not smoke.  Use of agents associated with hypertension: none.   Outside blood pressures are checked on ocassion Symptoms: No chest pain No chest pressure  No palpitations No syncope  No dyspnea No orthopnea  No paroxysmal nocturnal dyspnea No lower extremity edema   Pertinent labs Lab Results  Component Value Date   CHOL 132 08/07/2021   HDL 50 08/07/2021   LDLCALC 51 08/07/2021   TRIG 187 (H) 08/07/2021   CHOLHDL 2.6 08/07/2021   Lab Results  Component Value Date   NA 143 08/07/2021   K 4.1 08/07/2021   CREATININE 1.19 08/07/2021   EGFR 72 08/07/2021   GLUCOSE 117 (H) 08/07/2021     The 10-year ASCVD risk score (Arnett DK, et al., 2019) is: 10.8%  ---------------------------------------------------------------------------------------------------  Pt reports 'throwing his back out' at work this week. Denies severe pain, numbness, weakness, radiation of lumbar pain. Declines treatment.  Medications: Outpatient Medications Prior to Visit  Medication Sig   albuterol (VENTOLIN HFA) 108 (90 Base) MCG/ACT inhaler Inhale 2 puffs into the lungs every 6 (six) hours as needed for wheezing or shortness of breath.   amLODipine (NORVASC) 5 MG tablet Take 1 tablet (5 mg total) by mouth daily.   atorvastatin (LIPITOR) 20 MG tablet Take 1 tablet (20 mg total) by mouth daily.   blood glucose meter kit and supplies KIT Dispense based on patient and insurance preference. Use up to four times daily as  directed.   chlorthalidone (HYGROTON) 25 MG tablet Take 1 tablet (25 mg total) by mouth daily.   glucose blood ([facility] GLUCOSE METER TEST STRIPS) test strip Use as instructed   ibuprofen (ADVIL) 600 MG tablet Take 1 tablet (600 mg total) by mouth every 6 (six) hours as needed.   losartan (COZAAR) 100 MG tablet Take 1 tablet (100 mg total) by mouth daily.   metFORMIN (GLUCOPHAGE) 500 MG tablet Take 2 tablets (1,000 mg total) by mouth 2 (two) times daily with a meal.   No facility-administered medications prior to visit.    Review of Systems  Constitutional:  Negative for fatigue and fever.  Respiratory:  Negative for cough and shortness of breath.   Cardiovascular:  Negative for chest pain, palpitations and leg swelling.  Musculoskeletal:  Positive for back pain.  Neurological:  Negative for dizziness and headaches.      Objective    Blood pressure (!) 156/100, pulse (!) 108, height 6' (1.829 m), weight 242 lb (109.8 kg), SpO2 100 %.   Physical Exam Constitutional:      General: He is awake.     Appearance: He is well-developed.  HENT:     Head: Normocephalic.  Eyes:     Conjunctiva/sclera: Conjunctivae normal.  Cardiovascular:     Rate and Rhythm: Normal rate and regular rhythm.     Heart sounds: Normal heart sounds.  Pulmonary:     Effort: Pulmonary effort is normal.     Breath sounds: Normal breath sounds.  Skin:    General: Skin is warm.  Neurological:     Mental Status: He is alert and oriented to person, place, and time.  Psychiatric:        Attention and Perception: Attention normal.        Mood and Affect: Mood normal.        Speech: Speech normal.        Behavior: Behavior is cooperative.     Results for orders placed or performed in visit on 11/12/21  POCT HgB A1C  Result Value Ref Range   Hemoglobin A1C 6.3 (A) 4.0 - 5.6 %   HbA1c POC (<> result, manual entry)     HbA1c, POC (prediabetic range)     HbA1c, POC (controlled diabetic range)      Assessment &  Plan     Problem List Items Addressed This Visit       Cardiovascular and Mediastinum   Hypertension associated with diabetes ([place])    Managed with losartan 100 mg, chlorthalidone 25 mg and amlodipine 5 mg  Reviewed last cmp  Pt does not check at home and elevated today He thinks 2/2 to back pain  F/u 4 weeks htn check         Endocrine   Diabetes mellitus without complication (HCC) - Primary    A1c today stable at 6.3% managed with metformin 1000 mg BID Happy with stability Uacr, foot exam utd. Reminded  optho  On statin LDL at goal Stressed vaccines F/u 4 mo cpe      Other Visit Diagnoses     Need for diphtheria-tetanus-pertussis (Tdap) vaccine       Relevant Orders   Tdap vaccine greater than or equal to 7yo IM (Completed)      Pt declines flu vaccine, encouraged shingles.  Return in about 4 weeks (around 12/10/2021) for hypertension, 4 mo CPE.      I, Mikey Kirschner, PA-C have reviewed all documentation for this visit. The documentation on  11/12/2021 for the exam, diagnosis, procedures, and orders are all accurate and complete.  Mikey Kirschner, PA-C [facility] [address] [place], [place], [address] Office: [phone] Fax: [place]

## 2021-11-12 NOTE — Assessment & Plan Note (Addendum)
Managed with losartan 100 mg, chlorthalidone 25 mg and amlodipine 5 mg  Reviewed last cmp  Pt does not check at home and elevated today He thinks 2/2 to back pain  F/u 4 weeks htn check

## 2021-11-12 NOTE — Assessment & Plan Note (Addendum)
A1c today stable at 6.3% managed with metformin 1000 mg BID Happy with stability Uacr, foot exam utd. Reminded optho  On statin LDL at goal Stressed vaccines F/u 4 mo cpe

## 2021-11-12 NOTE — Patient Instructions (Signed)
Zoster Vaccine, Recombinant injection What is this medication? ZOSTER VACCINE (ZOS ter vak SEEN) is a vaccine used to reduce the risk of getting shingles. This vaccine is not used to treat shingles or nerve pain from shingles. This medicine may be used for other purposes; ask your health care provider or pharmacist if you have questions. COMMON BRAND NAME(S): SHINGRIX What should I tell my care team before I take this medication? They need to know if you have any of these conditions: cancer immune system problems an unusual or allergic reaction to Zoster vaccine, other medications, foods, dyes, or preservatives pregnant or trying to get pregnant breast-feeding How should I use this medication? This vaccine is injected into a muscle. It is given by a health care provider. A copy of Vaccine Information Statements will be given before each vaccination. Be sure to read this information carefully each time. This sheet may change often. Talk to your health care provider about the use of this vaccine in children. This vaccine is not approved for use in children. Overdosage: If you think you have taken too much of this medicine contact a poison control center or emergency room at once. NOTE: This medicine is only for you. Do not share this medicine with others. What if I miss a dose? Keep appointments for follow-up (booster) doses. It is important not to miss your dose. Call your health care provider if you are unable to keep an appointment. What may interact with this medication? medicines that suppress your immune system medicines to treat cancer steroid medicines like prednisone or cortisone This list may not describe all possible interactions. Give your health care provider a list of all the medicines, herbs, non-prescription drugs, or dietary supplements you use. Also tell them if you smoke, drink alcohol, or use illegal drugs. Some items may interact with your medicine. What should I watch for  while using this medication? Visit your health care provider regularly. This vaccine, like all vaccines, may not fully protect everyone. What side effects may I notice from receiving this medication? Side effects that you should report to your doctor or health care professional as soon as possible: allergic reactions (skin rash, itching or hives; swelling of the face, lips, or tongue) trouble breathing Side effects that usually do not require medical attention (report these to your doctor or health care professional if they continue or are bothersome): chills headache fever nausea pain, redness, or irritation at site where injected tiredness vomiting This list may not describe all possible side effects. Call your doctor for medical advice about side effects. You may report side effects to FDA at 1-800-FDA-1088. Where should I keep my medication? This vaccine is only given by a health care provider. It will not be stored at home. NOTE: This sheet is a summary. It may not cover all possible information. If you have questions about this medicine, talk to your doctor, pharmacist, or health care provider.  2023 Elsevier/Gold Standard (2021-01-17 00:00:00)  

## 2021-12-12 ENCOUNTER — Ambulatory Visit: Payer: BC Managed Care – PPO | Admitting: Physician Assistant

## 2021-12-31 ENCOUNTER — Encounter: Payer: Self-pay | Admitting: Physician Assistant

## 2021-12-31 ENCOUNTER — Ambulatory Visit (INDEPENDENT_AMBULATORY_CARE_PROVIDER_SITE_OTHER): Payer: BC Managed Care – PPO | Admitting: Physician Assistant

## 2021-12-31 DIAGNOSIS — I152 Hypertension secondary to endocrine disorders: Secondary | ICD-10-CM | POA: Diagnosis not present

## 2021-12-31 DIAGNOSIS — E1159 Type 2 diabetes mellitus with other circulatory complications: Secondary | ICD-10-CM

## 2021-12-31 MED ORDER — AMLODIPINE BESYLATE 10 MG PO TABS: 10.0000 mg | ORAL_TABLET | Freq: Every day | ORAL | 1 refills | Status: DC

## 2021-12-31 NOTE — Progress Notes (Signed)
I,Sha'taria Tyson,acting as a Education administrator for [employer], PA-C.,have documented all relevant documentation on the behalf of Mikey Kirschner, PA-C,as directed by  Mikey Kirschner, PA-C while in the presence of Mikey Kirschner, PA-C.   Established patient visit   Patient: Jerome Graves   DOB: Oct 17, 1965   56 y.o. Male  MRN: [id number] Visit Date: 12/31/2021  Today's healthcare provider: Mikey Kirschner, PA-C   Cc. Htn f/u  Subjective    HPI  Hypertension, follow-up  BP Readings from Last 3 Encounters:  12/31/21 (!) 141/90  11/12/21 (!) 143/99  08/07/21 (!) 154/98   Wt Readings from Last 3 Encounters:  12/31/21 246 lb 9.6 oz (111.9 kg)  11/12/21 242 lb (109.8 kg)  08/07/21 237 lb (107.5 kg)     He was last seen for hypertension 3 weeks ago.  BP at that visit was 143/99. Management since that visit includes managed with losartan 100 mg, chlorthalidone 25 mg and amlodipine 5 mg .  He reports excellent compliance with treatment. He is not having side effects.  He is following a Regular diet. He is not exercising. He does not smoke.  Use of agents associated with hypertension: none.   Outside blood pressures are 135-140/80-90; no exact values brought in Symptoms: No chest pain No chest pressure  No palpitations No syncope  No dyspnea No orthopnea  No paroxysmal nocturnal dyspnea No lower extremity edema   Pertinent labs Lab Results  Component Value Date   CHOL 132 08/07/2021   HDL 50 08/07/2021   LDLCALC 51 08/07/2021   TRIG 187 (H) 08/07/2021   CHOLHDL 2.6 08/07/2021   Lab Results  Component Value Date   NA 143 08/07/2021   K 4.1 08/07/2021   CREATININE 1.19 08/07/2021   EGFR 72 08/07/2021   GLUCOSE 117 (H) 08/07/2021     The 10-year ASCVD risk score (Arnett DK, et al., 2019) is: 9.1%  ---------------------------------------------------------------------------------------------------   Medications: Outpatient Medications Prior to Visit  Medication Sig    atorvastatin (LIPITOR) 20 MG tablet Take 1 tablet (20 mg total) by mouth daily.   blood glucose meter kit and supplies KIT Dispense based on patient and insurance preference. Use up to four times daily as directed.   chlorthalidone (HYGROTON) 25 MG tablet Take 1 tablet (25 mg total) by mouth daily.   glucose blood ([facility] GLUCOSE METER TEST STRIPS) test strip Use as instructed   ibuprofen (ADVIL) 600 MG tablet Take 1 tablet (600 mg total) by mouth every 6 (six) hours as needed.   losartan (COZAAR) 100 MG tablet Take 1 tablet (100 mg total) by mouth daily.   [DISCONTINUED] amLODipine (NORVASC) 5 MG tablet Take 1 tablet (5 mg total) by mouth daily.   albuterol (VENTOLIN HFA) 108 (90 Base) MCG/ACT inhaler Inhale 2 puffs into the lungs every 6 (six) hours as needed for wheezing or shortness of breath. (Patient not taking: Reported on 12/31/2021)   metFORMIN (GLUCOPHAGE) 500 MG tablet Take 2 tablets (1,000 mg total) by mouth 2 (two) times daily with a meal.   No facility-administered medications prior to visit.     Objective    Blood pressure (!) 141/90, pulse (!) 113, height 6' (1.829 m), weight 246 lb 9.6 oz (111.9 kg), SpO2 100 %.   Physical Exam Vitals reviewed.  Constitutional:      Appearance: He is not ill-appearing.  HENT:     Head: Normocephalic.  Eyes:     Conjunctiva/sclera: Conjunctivae normal.  Cardiovascular:  Rate and Rhythm: Normal rate.  Pulmonary:     Effort: Pulmonary effort is normal. No respiratory distress.  Neurological:     General: No focal deficit present.     Mental Status: He is alert and oriented to person, place, and time.  Psychiatric:        Mood and Affect: Mood normal.        Behavior: Behavior normal.     No results found for any visits on 12/31/21.  Assessment & Plan     Problem List Items Addressed This Visit       Cardiovascular and Mediastinum   Hypertension associated with diabetes ([place])    Chronic, still uncontrolled Managed with  losartan 100 mg, chlorthalidone 25 mg, and amlodipine 5 mg -- advised increasing to amlodipine 10 mg.  Monitor for orthostatic symptoms F/u 4 weeks      Relevant Medications   amLODipine (NORVASC) 10 MG tablet     Return in about 4 weeks (around 01/28/2022) for hypertension.      I, Mikey Kirschner, PA-C have reviewed all documentation for this visit. The documentation on  12/31/2021 for the exam, diagnosis, procedures, and orders are all accurate and complete.  Mikey Kirschner, PA-C [facility] [address] [place], [place], [address] Office: [phone] Fax: [place]

## 2021-12-31 NOTE — Assessment & Plan Note (Signed)
Chronic, still uncontrolled Managed with losartan 100 mg, chlorthalidone 25 mg, and amlodipine 5 mg -- advised increasing to amlodipine 10 mg.  Monitor for orthostatic symptoms F/u 4 weeks

## 2022-01-03 ENCOUNTER — Other Ambulatory Visit: Payer: Self-pay | Admitting: Physician Assistant

## 2022-01-03 DIAGNOSIS — I10 Essential (primary) hypertension: Secondary | ICD-10-CM

## 2022-01-03 DIAGNOSIS — E1165 Type 2 diabetes mellitus with hyperglycemia: Secondary | ICD-10-CM

## 2022-01-05 NOTE — Telephone Encounter (Signed)
Requested Prescriptions  Pending Prescriptions Disp Refills   chlorthalidone (HYGROTON) 25 MG tablet [Pharmacy Med Name: CHLORTHALIDONE 25MG TABLETS] 90 tablet 1    Sig: TAKE 1 TABLET(25 MG) BY MOUTH DAILY     Cardiovascular: Diuretics - Thiazide Failed - 01/03/2022  3:15 AM      Failed - Last BP in normal range    BP Readings from Last 1 Encounters:  12/31/21 (!) 141/90         Passed - Cr in normal range and within 180 days    Creatinine  Date Value Ref Range Status  08/08/2013 1.10 0.60 - 1.30 mg/dL Final   Creatinine, Ser  Date Value Ref Range Status  08/07/2021 1.19 0.76 - 1.27 mg/dL Final         Passed - K in normal range and within 180 days    Potassium  Date Value Ref Range Status  08/07/2021 4.1 3.5 - 5.2 mmol/L Final  08/08/2013 3.5 3.5 - 5.1 mmol/L Final         Passed - Na in normal range and within 180 days    Sodium  Date Value Ref Range Status  08/07/2021 143 134 - 144 mmol/L Final  08/08/2013 139 136 - 145 mmol/L Final         Passed - Valid encounter within last 6 months    Recent Outpatient Visits           5 days ago Hypertension associated with diabetes ([place])   [facility]key Kirschner, [facility]-C   1 month ago Type 2 diabetes mellitus without complication, without long-term current use of insulin ([place])   [facility] Thedore Mins, [place], PA-C   5 months ago Type 2 diabetes mellitus with hyperglycemia, without long-term current use of insulin ([place])   [facility] Thedore Mins, [place], PA-C   7 months ago Type 2 diabetes mellitus with hyperglycemia, without long-term current use of insulin ([place])   [facility] Thedore Mins, [place], PA-C   8 months ago Blood glucose elevated   [employer], Dani Gobble, PA-C       Future Appointments             In 3 weeks Drubel, Ria Comment, PA-C [facility], [place]   In 2 months Drubel, [place], PA-C [facility], [facility]              metFORMIN (GLUCOPHAGE) 500 MG tablet [employer] Med Name: METFORMIN 500MG TABLETS] 360 tablet 1    Sig: TAKE 2 TABLETS(1000 MG) BY MOUTH TWICE DAILY WITH A MEAL     Endocrinology:  Diabetes - Biguanides Failed - 01/03/2022  3:15 AM      Failed - B12 Level in normal range and within 720 days    No results found for: "VITAMINB12"       Passed - Cr in normal range and within 360 days    Creatinine  Date Value Ref Range Status  08/08/2013 1.10 0.60 - 1.30 mg/dL Final   Creatinine, Ser  Date Value Ref Range Status  08/07/2021 1.19 0.76 - 1.27 mg/dL Final         Passed - HBA1C is between 0 and 7.9 and within 180 days    Hemoglobin A1C  Date Value Ref Range Status  11/12/2021 6.3 (A) 4.0 - 5.6 % Final   Hgb A1c MFr Bld  Date Value Ref Range Status  05/15/2021 11.3 (H) 4.8 - 5.6 % Final    Comment:  Prediabetes: 5.7 - 6.4          Diabetes: >6.4          Glycemic control for adults with diabetes: <7.0          Passed - eGFR in normal range and within 360 days    EGFR (African American)  Date Value Ref Range Status  08/08/2013 >60  Final   EGFR (Non-African Amer.)  Date Value Ref Range Status  08/08/2013 >60  Final    Comment:    eGFR values <57m/min/1.73 m2 may be an indication of chronic kidney disease (CKD). Calculated eGFR is useful in patients with stable renal function. The eGFR calculation will not be reliable in acutely ill patients when serum creatinine is changing rapidly. It is not useful in  patients on dialysis. The eGFR calculation may not be applicable to patients at the low and high extremes of body sizes, pregnant women, and vegetarians.    GFR, Estimated  Date Value Ref Range Status  05/16/2021 54 (L) >60 mL/min Final    Comment:    (NOTE) Calculated using the CKD-EPI Creatinine Equation (2021)    eGFR  Date Value Ref Range Status  08/07/2021 72 >59 mL/min/1.73 Final         Passed - Valid encounter within last 6 months     Recent Outpatient Visits           5 days ago Hypertension associated with diabetes ([facility]   [facility] [place] [facility]   1 month ago Type 2 diabetes mellitus without complication, without long-term current use of insulin ([place]   [facility] [place] [facility]   5 months ago Type 2 diabetes mellitus with hyperglycemia, without long-term current use of insulin ([place]   [facility] [place] PA-C   7 months ago Type 2 diabetes mellitus with hyperglycemia, without long-term current use of insulin ([place]   [facility] [place] PA-C   8 months ago Blood glucose elevated   BC[employer] EDani Gobble PA-C       Future Appointments             In 3 weeks Drubel, LRia Comment PA-C [facility]   In 2 months Drubel, LDennis Port PA-C [facility]            Passed - CBC within normal limits and completed in the last 12 months    WBC  Date Value Ref Range Status  05/16/2021 15.2 (H) 4.0 - 10.5 K/uL Final   RBC  Date Value Ref Range Status  05/16/2021 5.67 4.22 - 5.81 MIL/uL Final   Hemoglobin  Date Value Ref Range Status  05/16/2021 16.8 13.0 - 17.0 g/dL Final  05/15/2021 17.2 13.0 - 17.7 g/dL Final   HCT  Date Value Ref Range Status  05/16/2021 48.7 39.0 - 52.0 % Final   Hematocrit  Date Value Ref Range Status  05/15/2021 50.8 37.5 - 51.0 % Final   MCHC  Date Value Ref Range Status  05/16/2021 34.5 30.0 - 36.0 g/dL Final   [facility] Date Value Ref Range Status  05/16/2021 29.6 26.0 - 34.0 pg Final   MCV  Date Value Ref Range Status  05/16/2021 85.9 80.0 - 100.0 fL Final  05/15/2021 89 79 - 97 fL Final  08/08/2013 88 80 - 100 fL Final   No results found for: "PLTCOUNTKUC", "LABPLAT", "POCPLA" RDW  Date Value Ref Range Status  05/16/2021  11.7 11.5 - 15.5 % Final  05/15/2021 12.2 11.6 - 15.4 % Final  08/08/2013 12.8 11.5 - 14.5 % Final

## 2022-01-05 NOTE — Telephone Encounter (Signed)
Requested medications are due for refill today.  yes  Requested medications are on the active medications list.  yes  Last refill. 07/09/2021 #360 1 rf  Future visit scheduled.   yes  Notes to clinic.  Rx written to expire 10/07/2021 - Rx is expired.    Requested Prescriptions  Pending Prescriptions Disp Refills   metFORMIN (GLUCOPHAGE) 500 MG tablet [Pharmacy Med Name: METFORMIN 500MG TABLETS] 360 tablet 1    Sig: TAKE 2 TABLETS(1000 MG) BY MOUTH TWICE DAILY WITH A MEAL     Endocrinology:  Diabetes - Biguanides Failed - 01/03/2022  3:15 AM      Failed - B12 Level in normal range and within 720 days    No results found for: "VITAMINB12"       Passed - Cr in normal range and within 360 days    Creatinine  Date Value Ref Range Status  08/08/2013 1.10 0.60 - 1.30 mg/dL Final   Creatinine, Ser  Date Value Ref Range Status  08/07/2021 1.19 0.76 - 1.27 mg/dL Final         Passed - HBA1C is between 0 and 7.9 and within 180 days    Hemoglobin A1C  Date Value Ref Range Status  11/12/2021 6.3 (A) 4.0 - 5.6 % Final   Hgb A1c MFr Bld  Date Value Ref Range Status  05/15/2021 11.3 (H) 4.8 - 5.6 % Final    Comment:             Prediabetes: 5.7 - 6.4          Diabetes: >6.4          Glycemic control for adults with diabetes: <7.0          Passed - eGFR in normal range and within 360 days    EGFR (African American)  Date Value Ref Range Status  08/08/2013 >60  Final   EGFR (Non-African Amer.)  Date Value Ref Range Status  08/08/2013 >60  Final    Comment:    eGFR values <40m/min/1.73 m2 may be an indication of chronic kidney disease (CKD). Calculated eGFR is useful in patients with stable renal function. The eGFR calculation will not be reliable in acutely ill patients when serum creatinine is changing rapidly. It is not useful in  patients on dialysis. The eGFR calculation may not be applicable to patients at the low and high extremes of body sizes, pregnant women, and  vegetarians.    GFR, Estimated  Date Value Ref Range Status  05/16/2021 54 (L) >60 mL/min Final    Comment:    (NOTE) Calculated using the CKD-EPI Creatinine Equation (2021)    eGFR  Date Value Ref Range Status  08/07/2021 72 >59 mL/min/1.73 Final         Passed - Valid encounter within last 6 months    Recent Outpatient Visits           5 days ago Hypertension associated with diabetes ([facility]   [facility] [place] PA-C   1 month ago Type 2 diabetes mellitus without complication, without long-term current use of insulin ([place]   [facility] [place] PA-C   5 months ago Type 2 diabetes mellitus with hyperglycemia, without long-term current use of insulin ([facility]   [facility]Thedore Mins [place] PA-C   7 months ago Type 2 diabetes mellitus with hyperglycemia, without long-term current use of insulin ([facility]   [place] PA-C   8 months ago Blood  glucose elevated   [facility] [place], Dani Gobble, PA-C       Future Appointments             In 3 weeks Drubel, Ria Comment, PA-C [facility], [facility]   In 2 months Drubel, [place], PA-C [facility], [facility]            Passed - CBC within normal limits and completed in the last 12 months    WBC  Date Value Ref Range Status  05/16/2021 15.2 (H) 4.0 - 10.5 K/uL Final   RBC  Date Value Ref Range Status  05/16/2021 5.67 4.22 - 5.81 MIL/uL Final   Hemoglobin  Date Value Ref Range Status  05/16/2021 16.8 13.0 - 17.0 g/dL Final  05/15/2021 17.2 13.0 - 17.7 g/dL Final   HCT  Date Value Ref Range Status  05/16/2021 48.7 39.0 - 52.0 % Final   Hematocrit  Date Value Ref Range Status  05/15/2021 50.8 37.5 - 51.0 % Final   MCHC  Date Value Ref Range Status  05/16/2021 34.5 30.0 - 36.0 g/dL Final   [facility]  Date Value Ref Range Status  05/16/2021 29.6 26.0 - 34.0 pg Final   MCV  Date Value Ref Range Status   05/16/2021 85.9 80.0 - 100.0 fL Final  05/15/2021 89 79 - 97 fL Final  08/08/2013 88 80 - 100 fL Final   No results found for: "PLTCOUNTKUC", "LABPLAT", "POCPLA" RDW  Date Value Ref Range Status  05/16/2021 11.7 11.5 - 15.5 % Final  05/15/2021 12.2 11.6 - 15.4 % Final  08/08/2013 12.8 11.5 - 14.5 % Final         Signed Prescriptions Disp Refills   chlorthalidone (HYGROTON) 25 MG tablet 90 tablet 1    Sig: TAKE 1 TABLET(25 MG) BY MOUTH DAILY     Cardiovascular: Diuretics - Thiazide Failed - 01/03/2022  3:15 AM      Failed - Last BP in normal range    BP Readings from Last 1 Encounters:  12/31/21 (!) 141/90         Passed - Cr in normal range and within 180 days    Creatinine  Date Value Ref Range Status  08/08/2013 1.10 0.60 - 1.30 mg/dL Final   Creatinine, Ser  Date Value Ref Range Status  08/07/2021 1.19 0.76 - 1.27 mg/dL Final         Passed - K in normal range and within 180 days    Potassium  Date Value Ref Range Status  08/07/2021 4.1 3.5 - 5.2 mmol/L Final  08/08/2013 3.5 3.5 - 5.1 mmol/L Final         Passed - Na in normal range and within 180 days    Sodium  Date Value Ref Range Status  08/07/2021 143 134 - 144 mmol/L Final  08/08/2013 139 136 - 145 mmol/L Final         Passed - Valid encounter within last 6 months    Recent Outpatient Visits           5 days ago Hypertension associated with diabetes [facility])   [facility] Mikey Kirschner, PA-C   1 month ago Type 2 diabetes mellitus without complication, without long-term current use of insulin ([place])   [facility] [place], [place], PA-C   5 months ago Type 2 diabetes mellitus with hyperglycemia, without long-term current use of insulin [facility])   [facility] [place], [place], PA-C   7 months ago Type  2 diabetes mellitus with hyperglycemia, without long-term current use of insulin [facility])   [facility] Thedore Mins, [place], PA-C   8 months ago Blood  glucose elevated   [place], Dani Gobble, PA-C       Future Appointments             In 3 weeks Thedore Mins, Ria Comment, PA-C [facility], [place]   In 2 months Mikey Kirschner, PA-C [facility], [place]

## 2022-01-29 NOTE — Progress Notes (Signed)
Established patient visit   Patient: Jerome Graves   DOB: May 06, 1965   56 y.o. Male  MRN: [id number] Visit Date: 01/30/2022  Today's healthcare provider: Mikey Kirschner, PA-C  Cc. Htn f/u  Subjective    HPI  Pt reports symptoms of sinus pressure, headaches, nasal congestion, x 4 weeks. Reports taking sudafed otc for a few weeks with only limited relief. Denies fevers, cough, sob. Hypertension, follow-up  BP Readings from Last 3 Encounters:  01/30/22 (!) 133/90  12/31/21 (!) 141/90  11/12/21 (!) 143/99   Wt Readings from Last 3 Encounters:  01/30/22 247 lb 14.4 oz (112.4 kg)  12/31/21 246 lb 9.6 oz (111.9 kg)  11/12/21 242 lb (109.8 kg)     He was last seen for hypertension 1 months ago.  BP at that visit was 141/90. Management since that visit includes monitoring at home.  He reports excellent compliance with treatment. He is not having side effects.  Use of agents associated with hypertension: amphetamines and decongestants.   Outside blood pressures are not being checked. Symptoms: No chest pain No chest pressure  No palpitations No syncope  No dyspnea No orthopnea  No paroxysmal nocturnal dyspnea No lower extremity edema   Pertinent labs Lab Results  Component Value Date   CHOL 132 08/07/2021   HDL 50 08/07/2021   LDLCALC 51 08/07/2021   TRIG 187 (H) 08/07/2021   CHOLHDL 2.6 08/07/2021   Lab Results  Component Value Date   NA 143 08/07/2021   K 4.1 08/07/2021   CREATININE 1.19 08/07/2021   EGFR 72 08/07/2021   GLUCOSE 117 (H) 08/07/2021     The 10-year ASCVD risk score (Arnett DK, et al., 2019) is: 8.2%  ---------------------------------------------------------------------------------------------------   Medications: Outpatient Medications Prior to Visit  Medication Sig   amLODipine (NORVASC) 10 MG tablet Take 1 tablet (10 mg total) by mouth daily.   atorvastatin (LIPITOR) 20 MG tablet Take 1 tablet (20 mg total) by mouth daily.   blood  glucose meter kit and supplies KIT Dispense based on patient and insurance preference. Use up to four times daily as directed.   chlorthalidone (HYGROTON) 25 MG tablet TAKE 1 TABLET(25 MG) BY MOUTH DAILY   glucose blood ([employer] GLUCOSE METER TEST STRIPS) test strip Use as instructed   ibuprofen (ADVIL) 600 MG tablet Take 1 tablet (600 mg total) by mouth every 6 (six) hours as needed.   losartan (COZAAR) 100 MG tablet Take 1 tablet (100 mg total) by mouth daily.   metFORMIN (GLUCOPHAGE) 500 MG tablet TAKE 2 TABLETS(1000 MG) BY MOUTH TWICE DAILY WITH A MEAL   albuterol (VENTOLIN HFA) 108 (90 Base) MCG/ACT inhaler Inhale 2 puffs into the lungs every 6 (six) hours as needed for wheezing or shortness of breath. (Patient not taking: Reported on 12/31/2021)   No facility-administered medications prior to visit.    Review of Systems  Constitutional:  Negative for fatigue and fever.  HENT:  Positive for congestion, postnasal drip, rhinorrhea and sinus pressure.   Respiratory:  Negative for cough and shortness of breath.   Cardiovascular:  Negative for chest pain, palpitations and leg swelling.  Neurological:  Positive for headaches. Negative for dizziness.     Objective    Blood pressure (!) 133/90, pulse (!) 120, temperature 98.1 F (36.7 C), temperature source Oral, weight 247 lb 14.4 oz (112.4 kg), SpO2 99 %.   Physical Exam Constitutional:      General: He is awake.  Appearance: He is well-developed.  HENT:     Head: Normocephalic.     Right Ear: Tympanic membrane normal.     Left Ear: Tympanic membrane normal.     Nose: Congestion present.  Eyes:     Conjunctiva/sclera: Conjunctivae normal.  Cardiovascular:     Rate and Rhythm: Normal rate and regular rhythm.     Heart sounds: Normal heart sounds.  Pulmonary:     Effort: Pulmonary effort is normal.     Breath sounds: Normal breath sounds.  Skin:    General: Skin is warm.  Neurological:     Mental Status: He is alert and oriented  to person, place, and time.  Psychiatric:        Attention and Perception: Attention normal.        Mood and Affect: Mood normal.        Speech: Speech normal.        Behavior: Behavior is cooperative.     No results found for any visits on 01/30/22.  Assessment & Plan     2. Acute sinusitis Rx amoxicillin 875 mg bid x 7  days  Advised to switch to mucinex and zyrtec otc.  Pt declines nasal sprays  Problem List Items Addressed This Visit       Cardiovascular and Mediastinum   Hypertension associated with diabetes ([place]) - Primary    Slightly improved, likely elevated 2/2 sudafed use. Will continue to monitor Continue meds as prescribed F/u 4 mo       Other Visit Diagnoses     Acute non-recurrent frontal sinusitis       Relevant Medications   amoxicillin (AMOXIL) 875 MG tablet        Return in about 4 months (around 06/01/2022) for DMII, hypertension.      I, Mikey Kirschner, PA-C have reviewed all documentation for this visit. The documentation on  01/30/2022  for the exam, diagnosis, procedures, and orders are all accurate and complete.  Mikey Kirschner, PA-C [facility] [address] [place], [place], [address] Office: [phone] Fax: [place]

## 2022-01-30 ENCOUNTER — Encounter: Payer: Self-pay | Admitting: Physician Assistant

## 2022-01-30 ENCOUNTER — Ambulatory Visit (INDEPENDENT_AMBULATORY_CARE_PROVIDER_SITE_OTHER): Payer: BC Managed Care – PPO | Admitting: Physician Assistant

## 2022-01-30 VITALS — BP 133/90 | HR 120 | Temp 98.1°F | Wt 247.9 lb

## 2022-01-30 DIAGNOSIS — E1159 Type 2 diabetes mellitus with other circulatory complications: Secondary | ICD-10-CM

## 2022-01-30 DIAGNOSIS — J0110 Acute frontal sinusitis, unspecified: Secondary | ICD-10-CM

## 2022-01-30 DIAGNOSIS — I152 Hypertension secondary to endocrine disorders: Secondary | ICD-10-CM | POA: Diagnosis not present

## 2022-01-30 MED ORDER — AMOXICILLIN 875 MG PO TABS: 875.0000 mg | ORAL_TABLET | Freq: Two times a day (BID) | ORAL | 0 refills | Status: AC

## 2022-01-30 NOTE — Assessment & Plan Note (Signed)
Slightly improved, likely elevated 2/2 sudafed use. Will continue to monitor Continue meds as prescribed F/u 4 mo

## 2022-03-20 ENCOUNTER — Encounter: Payer: BC Managed Care – PPO | Admitting: Physician Assistant

## 2022-04-24 ENCOUNTER — Ambulatory Visit (INDEPENDENT_AMBULATORY_CARE_PROVIDER_SITE_OTHER): Payer: BC Managed Care – PPO | Admitting: Physician Assistant

## 2022-04-24 ENCOUNTER — Encounter: Payer: Self-pay | Admitting: Physician Assistant

## 2022-04-24 VITALS — BP 156/96 | HR 86 | Temp 97.7°F | Ht 72.0 in | Wt 254.0 lb

## 2022-04-24 DIAGNOSIS — E1159 Type 2 diabetes mellitus with other circulatory complications: Secondary | ICD-10-CM

## 2022-04-24 DIAGNOSIS — E1169 Type 2 diabetes mellitus with other specified complication: Secondary | ICD-10-CM | POA: Diagnosis not present

## 2022-04-24 DIAGNOSIS — Z125 Encounter for screening for malignant neoplasm of prostate: Secondary | ICD-10-CM

## 2022-04-24 DIAGNOSIS — E119 Type 2 diabetes mellitus without complications: Secondary | ICD-10-CM | POA: Diagnosis not present

## 2022-04-24 DIAGNOSIS — Z0000 Encounter for general adult medical examination without abnormal findings: Secondary | ICD-10-CM

## 2022-04-24 DIAGNOSIS — I152 Hypertension secondary to endocrine disorders: Secondary | ICD-10-CM | POA: Diagnosis not present

## 2022-04-24 DIAGNOSIS — I471 Supraventricular tachycardia, unspecified: Secondary | ICD-10-CM

## 2022-04-24 DIAGNOSIS — E785 Hyperlipidemia, unspecified: Secondary | ICD-10-CM | POA: Diagnosis not present

## 2022-04-24 NOTE — Assessment & Plan Note (Signed)
Elevated in office. Managed on amlodipine 10 mg, chlorthalidone 25 mg, and losartan 100 mg Advised pt he needs to consult cardiology for further management. Referral placed. Ordered cmp

## 2022-04-24 NOTE — Assessment & Plan Note (Signed)
Last A1c 6.3%, ordered today Managed with metformin 1000 mg bid Uacr, foot exam utd. Optho referral was placed On statin, acei F/u 4-6 mo pending a1c

## 2022-04-24 NOTE — Assessment & Plan Note (Signed)
Denies recurrent symptoms

## 2022-04-24 NOTE — Progress Notes (Signed)
Complete physical exam   Patient: Jerome Graves   DOB: [birth year]   57 y.o. Male  MRN: [id number] Visit Date: 04/24/2022  Today's healthcare provider: Mikey Kirschner, PA-C   Cc. Cpe   Subjective    Jerome Graves is a 57 y.o. male who presents today for a complete physical exam.  Denies chest pain, SOB, palpitations. Reports eating poorly as he is driving more and gets fast food.  Past Medical History:  Diagnosis Date   GERD (gastroesophageal reflux disease)    Gross hematuria    History of kidney stones    Hypertension    Renal calculus, right    Right flank pain    Past Surgical History:  Procedure Laterality Date   CYSTOSCOPY W/ RETROGRADES Left 05/01/2021   Procedure: CYSTOSCOPY WITH RETROGRADE PYELOGRAM;  Surgeon: Billey Co, MD;  Location: [facility];  Service: Urology;  Laterality: Left;   CYSTOSCOPY WITH RETROGRADE PYELOGRAM, URETEROSCOPY AND STENT PLACEMENT Bilateral 09/13/2013   Procedure: CYSTOSCOPY WITH BILATERAL RETROGRADE PYELOGRAM, RIGHT URETEROSCOPY AND STENT PLACEMENT;  Surgeon: Alexis Frock, MD;  Location: [facility];  Service: Urology;  Laterality: Bilateral;   CYSTOSCOPY/URETEROSCOPY/HOLMIUM LASER/STENT PLACEMENT Left 05/01/2021   Procedure: CYSTOSCOPY/URETEROSCOPY/HOLMIUM LASER/STENT PLACEMENT;  Surgeon: Billey Co, MD;  Location: [facility];  Service: Urology;  Laterality: Left;   EXTRACORPOREAL SHOCK WAVE LITHOTRIPSY Left 05/01/2021   Procedure: EXTRACORPOREAL SHOCK WAVE LITHOTRIPSY (ESWL);  Surgeon: Billey Co, MD;  Location: [facility];  Service: Urology;  Laterality: Left;   HOLMIUM LASER APPLICATION Right [id number]   Procedure: HOLMIUM LASER APPLICATION;  Surgeon: Alexis Frock, MD;  Location: [facility];  Service: Urology;  Laterality: Right;   NEGATIVE SLEEP STUDY     2003 -- PER PT   PERCUTANEOUS NEPHROLITHOTRIPSY Left 2010   PINNING RIGHT RING FINGER FX  YRS AGO   Social History   Socioeconomic  History   Marital status: Married    Spouse name: Not on file   Number of children: Not on file   Years of education: Not on file   Highest education level: Not on file  Occupational History   Not on file  Tobacco Use   Smoking status: Never   Smokeless tobacco: Never  Vaping Use   Vaping Use: Never used  Substance and Sexual Activity   Alcohol use: Yes    Alcohol/week: 2.0 standard drinks of alcohol    Types: 2 Standard drinks or equivalent per week   Drug use: No   Sexual activity: Not on file  Other Topics Concern   Not on file  Social History Narrative   Not on file   Social Determinants of Health   Financial Resource Strain: Not on file  Food Insecurity: Not on file  Transportation Needs: Not on file  Physical Activity: Not on file  Stress: Not on file  Social Connections: Not on file  Intimate Partner Violence: Not on file   Family Status  Relation Name Status   Mother  Alive   Father  Deceased   Sister  Alive   Daughter  Alive   Sister  Alive   Daughter  Alive   Family History  Problem Relation Age of Onset   Cancer Father    No Known Allergies  Patient Care Team: Mikey Kirschner, PA-C as PCP - General (Physician Assistant)   Medications: Outpatient Medications Prior to Visit  Medication Sig   albuterol (VENTOLIN HFA) 108 (90 Base) MCG/ACT inhaler Inhale  2 puffs into the lungs every 6 (six) hours as needed for wheezing or shortness of breath.   amLODipine (NORVASC) 10 MG tablet Take 1 tablet (10 mg total) by mouth daily.   atorvastatin (LIPITOR) 20 MG tablet Take 1 tablet (20 mg total) by mouth daily.   blood glucose meter kit and supplies KIT Dispense based on patient and insurance preference. Use up to four times daily as directed.   chlorthalidone (HYGROTON) 25 MG tablet TAKE 1 TABLET(25 MG) BY MOUTH DAILY   glucose blood ([employer] GLUCOSE METER TEST STRIPS) test strip Use as instructed   losartan (COZAAR) 100 MG tablet Take 1 tablet (100 mg total) by  mouth daily.   metFORMIN (GLUCOPHAGE) 500 MG tablet TAKE 2 TABLETS(1000 MG) BY MOUTH TWICE DAILY WITH A MEAL   [DISCONTINUED] ibuprofen (ADVIL) 600 MG tablet Take 1 tablet (600 mg total) by mouth every 6 (six) hours as needed.   No facility-administered medications prior to visit.    Review of Systems  Constitutional:  Negative for fatigue and fever.  Respiratory:  Negative for cough and shortness of breath.   Cardiovascular:  Negative for chest pain, palpitations and leg swelling.  Neurological:  Negative for dizziness and headaches.     Objective    Blood pressure (!) 156/96, pulse 86, temperature 97.7 F (36.5 C), height 6' (1.829 m), weight 254 lb (115.2 kg), SpO2 98 %.    Physical Exam Constitutional:      General: He is awake.     Appearance: He is well-developed.  HENT:     Head: Normocephalic.     Right Ear: Tympanic membrane, ear canal and external ear normal.     Left Ear: Tympanic membrane, ear canal and external ear normal.     Nose: Nose normal. No congestion or rhinorrhea.     Mouth/Throat:     Mouth: Mucous membranes are moist.     Pharynx: No oropharyngeal exudate or posterior oropharyngeal erythema.  Eyes:     Pupils: Pupils are equal, round, and reactive to light.  Cardiovascular:     Rate and Rhythm: Normal rate and regular rhythm.     Heart sounds: Normal heart sounds.  Pulmonary:     Effort: Pulmonary effort is normal.     Breath sounds: Normal breath sounds.  Abdominal:     General: There is no distension.     Palpations: Abdomen is soft.     Tenderness: There is no abdominal tenderness. There is no guarding.  Musculoskeletal:     Cervical back: Normal range of motion.     Right lower leg: No edema.     Left lower leg: No edema.  Lymphadenopathy:     Cervical: No cervical adenopathy.  Skin:    General: Skin is warm.  Neurological:     Mental Status: He is alert and oriented to person, place, and time.  Psychiatric:        Attention and  Perception: Attention normal.        Mood and Affect: Mood normal.        Speech: Speech normal.        Behavior: Behavior normal. Behavior is cooperative.     Last depression screening scores    12/31/2021    8:43 AM 11/12/2021    8:20 AM 08/07/2021    8:19 AM  PHQ 2/9 Scores  PHQ - 2 Score 0 0 0  PHQ- 9 Score 0 0 0   Last fall risk screening  12/31/2021    8:43 AM  Fall Risk   Falls in the past year? 0  Number falls in past yr: 0  Injury with Fall? 0  Risk for fall due to : No Fall Risks  Follow up Falls evaluation completed   Last Audit-C alcohol use screening    12/31/2021    8:43 AM  Alcohol Use Disorder Test (AUDIT)  1. How often do you have a drink containing alcohol? 4  2. How many drinks containing alcohol do you have on a typical day when you are drinking? 0  3. How often do you have six or more drinks on one occasion? 2  AUDIT-C Score 6   A score of 3 or more in women, and 4 or more in men indicates increased risk for alcohol abuse, EXCEPT if all of the points are from question 1   No results found for any visits on 04/24/22.  Assessment & Plan    Routine Health Maintenance and Physical Exam  Exercise Activities and Dietary recommendations --balanced diet high in fiber and protein, low in sugars, carbs, fats. --physical activity/exercise 30 minutes 3-5 times a week     Immunization History  Administered Date(s) Administered   Tdap 11/12/2021    Health Maintenance  Topic Date Due   OPHTHALMOLOGY EXAM  Never done   Zoster Vaccines- Shingrix (1 of 2) Never done   COLONOSCOPY (Pts 45-3yr Insurance coverage will need to be confirmed)  05/17/2022 (Originally 02/21/2011)   Hepatitis C Screening  05/17/2022 (Originally 02/21/1984)   INFLUENZA VACCINE  05/31/2022 (Originally 09/30/2021)   HEMOGLOBIN A1C  05/13/2022   Diabetic kidney evaluation - eGFR measurement  08/08/2022   Diabetic kidney evaluation - Urine ACR  08/08/2022   FOOT EXAM  08/08/2022    DTaP/Tdap/Td (2 - Td or Tdap) 11/13/2031   HIV Screening  Completed   HPV VACCINES  Aged Out   COVID-19 Vaccine  Discontinued    Discussed health benefits of physical activity, and encouraged him to engage in regular exercise appropriate for his age and condition.  Problem List Items Addressed This Visit       Cardiovascular and Mediastinum   Hypertension associated with diabetes ([place]    Elevated in office. Managed on amlodipine 10 mg, chlorthalidone 25 mg, and losartan 100 mg Advised pt he needs to consult cardiology for further management. Referral placed. Ordered cmp       Relevant Orders   Comprehensive Metabolic Panel (CMET)   CBC w/Diff/Platelet   Ambulatory referral to Cardiology   SVT (supraventricular tachycardia)    Denies recurrent symptoms      Relevant Orders   Ambulatory referral to Cardiology     Endocrine   Diabetes mellitus without complication (HCC)    Last A1c 6.3%, ordered today Managed with metformin 1000 mg bid Uacr, foot exam utd. Optho referral was placed On statin, acei F/u 4-6 mo pending a1c      Relevant Orders   CBC w/Diff/Platelet   HgB A1c   Other Visit Diagnoses     Annual physical exam    -  Primary   Prostate cancer screening       Relevant Orders   PSA       Pt declines shingles vaccine today  Return in about 6 months (around 10/23/2022) for DMII, chronic conditions, hypertension.     I, LMikey Kirschner PA-C have reviewed all documentation for this visit. The documentation on  04/24/22 for the exam, diagnosis, procedures, and orders  are all accurate and complete.  Mikey Kirschner, PA-C [facility] [address] [place], [place], [address] Office: [phone] Fax: [place]

## 2022-04-25 LAB — CBC WITH DIFFERENTIAL/PLATELET
Basophils Absolute: 0.1 10*3/uL (ref 0.0–0.2)
Basos: 1 %
EOS (ABSOLUTE): 0.3 10*3/uL (ref 0.0–0.4)
Eos: 4 %
Hematocrit: 39.5 % (ref 37.5–51.0)
Hemoglobin: 13.7 g/dL (ref 13.0–17.7)
Immature Grans (Abs): 0 10*3/uL (ref 0.0–0.1)
Immature Granulocytes: 0 %
Lymphocytes Absolute: 1.6 10*3/uL (ref 0.7–3.1)
Lymphs: 22 %
MCH: 31.1 pg (ref 26.6–33.0)
MCHC: 34.7 g/dL (ref 31.5–35.7)
MCV: 90 fL (ref 79–97)
Monocytes Absolute: 0.6 10*3/uL (ref 0.1–0.9)
Monocytes: 8 %
Neutrophils Absolute: 4.6 10*3/uL (ref 1.4–7.0)
Neutrophils: 65 %
Platelets: 216 10*3/uL (ref 150–450)
RBC: 4.4 x10E6/uL (ref 4.14–5.80)
RDW: 11.6 % (ref 11.6–15.4)
WBC: 7.2 10*3/uL (ref 3.4–10.8)

## 2022-04-25 LAB — COMPREHENSIVE METABOLIC PANEL
ALT: 29 IU/L (ref 0–44)
AST: 19 IU/L (ref 0–40)
Albumin/Globulin Ratio: 2.4 — ABNORMAL HIGH (ref 1.2–2.2)
Albumin: 4.8 g/dL (ref 3.8–4.9)
Alkaline Phosphatase: 87 IU/L (ref 44–121)
BUN/Creatinine Ratio: 17 (ref 9–20)
BUN: 20 mg/dL (ref 6–24)
Bilirubin Total: 1.5 mg/dL — ABNORMAL HIGH (ref 0.0–1.2)
CO2: 19 mmol/L — ABNORMAL LOW (ref 20–29)
Calcium: 9.6 mg/dL (ref 8.7–10.2)
Chloride: 102 mmol/L (ref 96–106)
Creatinine, Ser: 1.16 mg/dL (ref 0.76–1.27)
Globulin, Total: 2 g/dL (ref 1.5–4.5)
Glucose: 122 mg/dL — ABNORMAL HIGH (ref 70–99)
Potassium: 4.5 mmol/L (ref 3.5–5.2)
Sodium: 140 mmol/L (ref 134–144)
Total Protein: 6.8 g/dL (ref 6.0–8.5)
eGFR: 74 mL/min/{1.73_m2} (ref >59)

## 2022-04-25 LAB — HEMOGLOBIN A1C
Est. average glucose Bld gHb Est-mCnc: 146 mg/dL
Hgb A1c MFr Bld: 6.7 % — ABNORMAL HIGH (ref 4.8–5.6)

## 2022-04-25 LAB — PSA: Prostate Specific Ag, Serum: 0.7 ng/mL (ref 0.0–4.0)

## 2022-05-10 ENCOUNTER — Other Ambulatory Visit: Payer: Self-pay | Admitting: Physician Assistant

## 2022-05-10 DIAGNOSIS — E1169 Type 2 diabetes mellitus with other specified complication: Secondary | ICD-10-CM

## 2022-05-11 ENCOUNTER — Other Ambulatory Visit: Payer: Self-pay | Admitting: Physician Assistant

## 2022-05-11 DIAGNOSIS — I152 Hypertension secondary to endocrine disorders: Secondary | ICD-10-CM

## 2022-05-11 NOTE — Telephone Encounter (Signed)
Requested Prescriptions  Pending Prescriptions Disp Refills   atorvastatin (LIPITOR) 20 MG tablet [Pharmacy Med Name: ATORVASTATIN '20MG'$  TABLETS] 90 tablet 0    Sig: TAKE 1 TABLET(20 MG) BY MOUTH DAILY     Cardiovascular:  Antilipid - Statins Failed - 05/10/2022  9:02 AM      Failed - Lipid Panel in normal range within the last 12 months    Cholesterol, Total  Date Value Ref Range Status  08/07/2021 132 100 - 199 mg/dL Final   LDL Chol Calc (NIH)  Date Value Ref Range Status  08/07/2021 51 0 - 99 mg/dL Final   HDL  Date Value Ref Range Status  08/07/2021 50 >39 mg/dL Final   Triglycerides  Date Value Ref Range Status  08/07/2021 187 (H) 0 - 149 mg/dL Final         Passed - Patient is not pregnant      Passed - Valid encounter within last 12 months    Recent Outpatient Visits           2 weeks ago Annual physical exam   [place] Mikey Kirschner, PA-C   3 months ago Hypertension associated with diabetes [facility])   [place] Mikey Kirschner, PA-C   4 months ago Hypertension associated with diabetes [facility])   [place] Mikey Kirschner, PA-C   6 months ago Type 2 diabetes mellitus without complication, without long-term current use of insulin [facility])   [place] Thedore Mins, [place], PA-C   9 months ago Type 2 diabetes mellitus with hyperglycemia, without long-term current use of insulin [facility])   [place] Mikey Kirschner, PA-C       Future Appointments             In 1 month Ellyn Hack, Leonie Green, MD [place] [facility] [place]   In 2 months Thedore Mins, [place] [facility], [place]

## 2022-05-12 ENCOUNTER — Ambulatory Visit: Payer: BC Managed Care – PPO | Admitting: Physician Assistant

## 2022-06-21 ENCOUNTER — Other Ambulatory Visit: Payer: Self-pay | Admitting: Physician Assistant

## 2022-06-21 DIAGNOSIS — I10 Essential (primary) hypertension: Secondary | ICD-10-CM

## 2022-06-24 ENCOUNTER — Other Ambulatory Visit: Payer: Self-pay | Admitting: Physician Assistant

## 2022-06-24 DIAGNOSIS — E1165 Type 2 diabetes mellitus with hyperglycemia: Secondary | ICD-10-CM

## 2022-06-25 ENCOUNTER — Ambulatory Visit: Payer: BC Managed Care – PPO | Attending: Cardiology | Admitting: Cardiology

## 2022-06-25 ENCOUNTER — Encounter: Payer: Self-pay | Admitting: Cardiology

## 2022-06-25 VITALS — BP 140/94 | HR 106 | Ht 72.0 in | Wt 247.0 lb

## 2022-06-25 DIAGNOSIS — E8881 Metabolic syndrome: Secondary | ICD-10-CM | POA: Diagnosis not present

## 2022-06-25 DIAGNOSIS — E1159 Type 2 diabetes mellitus with other circulatory complications: Secondary | ICD-10-CM | POA: Diagnosis not present

## 2022-06-25 DIAGNOSIS — E1169 Type 2 diabetes mellitus with other specified complication: Secondary | ICD-10-CM

## 2022-06-25 DIAGNOSIS — I471 Supraventricular tachycardia, unspecified: Secondary | ICD-10-CM | POA: Diagnosis not present

## 2022-06-25 DIAGNOSIS — E785 Hyperlipidemia, unspecified: Secondary | ICD-10-CM

## 2022-06-25 DIAGNOSIS — R0789 Other chest pain: Secondary | ICD-10-CM

## 2022-06-25 DIAGNOSIS — I152 Hypertension secondary to endocrine disorders: Secondary | ICD-10-CM

## 2022-06-25 MED ORDER — METOPROLOL SUCCINATE ER 25 MG PO TB24: 25.0000 mg | ORAL_TABLET | Freq: Every day | ORAL | 2 refills | Status: DC

## 2022-06-25 NOTE — Patient Instructions (Signed)
Medication Instructions:  Your physician has recommended you make the following change in your medication:  START - metoprolol succinate (TOPROL XL) 25 MG 24 hr tablet - Take 1 tablet (25 mg total) by mouth daily. **TAKE 1/2 TABLET FOR FIRST 2 WEEKS**  *If you need a refill on your cardiac medications before your next appointment, please call your pharmacy*  Lab Work: -None ordered If you have labs (blood work) drawn today and your tests are completely normal, you will receive your results only by: MyChart Message (if you have MyChart) OR A paper copy in the mail If you have any lab test that is abnormal or we need to change your treatment, we will call you to review the results.  Testing/Procedures: -None ordered  Follow-Up: At [facility], you and your health needs are our priority.  As part of our continuing mission to provide you with exceptional heart care, we have created designated Provider Care Teams.  These Care Teams include your primary Cardiologist (physician) and Advanced Practice Providers (APPs -  Physician Assistants and Nurse Practitioners) who all work together to provide you with the care you need, when you need it.  We recommend signing up for the patient portal called "MyChart".  Sign up information is provided on this After Visit Summary.  MyChart is used to connect with patients for Virtual Visits (Telemedicine).  Patients are able to view lab/test results, encounter notes, upcoming appointments, etc.  Non-urgent messages can be sent to your provider as well.   To learn more about what you can do with MyChart, go to [email or web address].    Your next appointment:   4 - 6 month(s)  Provider:   You may see Bryan Lemma, MD or one of the following Advanced Practice Providers on your designated Care Team:   Nicolasa Ducking, NP Eula Listen, PA-C Cadence Fransico Michael, PA-C Charlsie Quest, NP    Other Instructions -None

## 2022-06-25 NOTE — Progress Notes (Signed)
Primary Care Provider: Alfredia Ferguson, PA-C [facility] Cardiologist: None Electrophysiologist: None  Clinic Note: Chief Complaint  Patient presents with   New Patient (Initial Visit)    Cardiac evaluation requested by Alfredia Ferguson, PA-C-Hx of HTN/Diabetes/SVT; Denies chest pain/SOB    ===================================  ASSESSMENT/PLAN   Problem List Items Addressed This Visit       Cardiology Problems   SVT (supraventricular tachycardia) - Primary (Chronic)    Seems to be pretty well-controlled.  I suspect this was related to hyperglycemia and dehydration.  We talked about avoiding hyperglycemia and other triggers.  Discussed vagal maneuvers, maintaining adequate hydration.  Start Toprol 25 mg daily.      Relevant Medications   metoprolol succinate (TOPROL XL) 25 MG 24 hr tablet   Other Relevant Orders   EKG 12-Lead   Hypertension associated with diabetes (HCC) (Chronic)    BP still elevated today on max dose losartan 100 mg and amlodipine 10 mg.  We do have room to titrate up chlorthalidone, but with him having sinus tachycardia with history of SVT, would benefit from being on beta-blocker.  Plan: Start Toprol 25 mg daily.  Titrate up after 2 weeks from 1/2 tablet to full tablet.  Anticipate that this dose will probably be higher going forward in the future, can be titrated up by PCP or APP here in follow-up.      Relevant Medications   metoprolol succinate (TOPROL XL) 25 MG 24 hr tablet   Hyperlipidemia associated with type 2 diabetes mellitus (HCC) (Chronic)    LDL is pretty well-controlled but triglycerides were still little bit elevated back in June 2023.  Hopefully as glycemic control improves, the triglyceride levels will improve.  But with hypertension, hypercholesterolemia, and obesity meets the diagnosis of metabolic syndrome.  Agree with continuing Lipitor and Glucophage, complete but would strongly consider GLP-1 agonist.  Discussed  importance of dietary modification and exercise.      Relevant Medications   metoprolol succinate (TOPROL XL) 25 MG 24 hr tablet     Other   Other chest pain    No further chest pain episodes.      Metabolic syndrome (Chronic)    Criteria noted from obesity, hypertriglyceridemia and diabetes.  This places him at higher risk for cardiovascular disease.  We discussed whether or not he would benefit from Coronary Calcium Score and, however he is already on a statin with LDL of 51.  Glycemic control has improved, but most recent A1c was still at 6.7.  Would still benefit from improved glycemic control as well as weight loss.  Discussed importance of dietary modification and exercise.  Would also recommend GLP-1 agonist depending on next evaluation.  Not sure that we have a benefit at this point of Coronary Calcium Score evaluation because he is already on aggressive therapy.      ===================================  HPI:    MERLAND HOLNESS is an obese 57 y.o. male with PMH notable for HTN, DM-2 and GERD who is being seen today for the evaluation of History of SVT at the request of Alfredia Ferguson, PA-C.  Recent Hospitalizations:  [facility] ER visit 05/16/2021: Newly diagnosed diabetes with blood sugars in the 500 range.  Heart rate was also noted to be 170s at PCPs office, referred to ED.  Noted episode 4 days prior to this evaluation.  Felt dizzy and fell.  Was drinking a lot and urinating a lot. => EKG suggested SVT at a rate of 174 bpm. =>  Treated with adenosine.  He also had mild AKI.  ALANZO LAMB was recently evaluated by Alfredia Ferguson, PA-C [facility]) on April 24, 2021.  BP was 156/96.  No cardiac issues besides history of SVT.  Referred to cardiology. => BP was managed with amlodipine 10 mg daily, chlorthalidone 25 mg daily and losartan 100 mg daily.  Suggested referral to cardiology for blood pressure going forward.   Reviewed  CV studies:     The following studies were reviewed today: (if available, images/films reviewed: From Epic Chart or Care Everywhere) None:    Interval History:   KOLT MCWHIRTER presents here today for evaluation stating he is doing fairly well.  His blood sugars have been much better controlled since the ER visit back in March last year.  Really the episode of SVT occurred when he was hyperglycemic at his PCP office.  He was significantly dehydrated with polyuria polydipsia.  Since that episode he has not had any more prolonged episodes.  He barely feels any palpitations.  He does indicate that his blood pressures here are little bit higher than usual for him but at home they may range anywhere from 130/78 to 139/85 over the last week or 2.  He indicates that he is pretty sedentary and noticed his heart rate is usually in the high 90s to low 100s but was much higher back in the SVT episode.  He has made some dietary adjustments and is trying to work on weight loss.  He had gained some weight but is now lost back down to where he had been before.  Trying to eat more healthy. Other than baseline fast heart rate he denies any symptoms of chest pain pressure with rest or exertion.  No PND, or orthopnea with maybe occasional trivial edema at the end of the day.  No syncope/near syncope or TIA symptoms.  CV Review of Symptoms (Summary): no chest pain or dyspnea on exertion positive for - rapid heart rate negative for - edema, orthopnea, palpitations, paroxysmal nocturnal dyspnea, shortness of breath, or syncope, near syncope, TIA/amaurosis fugax, claudication.   Glycemic control improved - but varies with what he eats -- No polyuria/polydypsea.  REVIEWED OF SYSTEMS   Review of Systems  Constitutional:  Negative for malaise/fatigue.  Respiratory:  Negative for shortness of breath.   Cardiovascular:  Negative for leg swelling and PND.  Gastrointestinal:  Negative for blood in stool and melena.  Genitourinary:   Positive for frequency. Negative for hematuria.  Neurological:  Negative for dizziness and focal weakness.  Endo/Heme/Allergies:  Negative for polydipsia.  Psychiatric/Behavioral:  The patient is not nervous/anxious.    I have reviewed and (if needed) personally updated the patient's problem list, medications, allergies, past medical and surgical history, social and family history.   PAST MEDICAL HISTORY   Past Medical History:  Diagnosis Date   GERD (gastroesophageal reflux disease)    Gross hematuria    History of kidney stones    Hypertension    Renal calculus, right    Right flank pain     PAST SURGICAL HISTORY   Past Surgical History:  Procedure Laterality Date   CYSTOSCOPY W/ RETROGRADES Left 05/01/2021   Procedure: CYSTOSCOPY WITH RETROGRADE PYELOGRAM;  Surgeon: Sondra Come, MD;  Location: [facility];  Service: Urology;  Laterality: Left;   CYSTOSCOPY WITH RETROGRADE PYELOGRAM, URETEROSCOPY AND STENT PLACEMENT Bilateral 09/13/2013   Procedure: CYSTOSCOPY WITH BILATERAL RETROGRADE PYELOGRAM, RIGHT URETEROSCOPY AND STENT PLACEMENT;  Surgeon:  Sebastian Ache, MD;  Location: [facility];  Service: Urology;  Laterality: Bilateral;   CYSTOSCOPY/URETEROSCOPY/HOLMIUM LASER/STENT PLACEMENT Left 05/01/2021   Procedure: CYSTOSCOPY/URETEROSCOPY/HOLMIUM LASER/STENT PLACEMENT;  Surgeon: Sondra Come, MD;  Location: [facility];  Service: Urology;  Laterality: Left;   EXTRACORPOREAL SHOCK WAVE LITHOTRIPSY Left 05/01/2021   Procedure: EXTRACORPOREAL SHOCK WAVE LITHOTRIPSY (ESWL);  Surgeon: Sondra Come, MD;  Location: [facility];  Service: Urology;  Laterality: Left;   HOLMIUM LASER APPLICATION Right 09/13/2013   Procedure: HOLMIUM LASER APPLICATION;  Surgeon: Sebastian Ache, MD;  Location: [facility];  Service: Urology;  Laterality: Right;   NEGATIVE SLEEP STUDY     2003 -- PER PT   PERCUTANEOUS NEPHROLITHOTRIPSY Left 2010   PINNING RIGHT RING FINGER FX  YRS  AGO    MEDICATIONS/ALLERGIES   Current Meds  Medication Sig   amLODipine (NORVASC) 10 MG tablet TAKE 1 TABLET(10 MG) BY MOUTH DAILY   atorvastatin (LIPITOR) 20 MG tablet TAKE 1 TABLET(20 MG) BY MOUTH DAILY   blood glucose meter kit and supplies KIT Dispense based on patient and insurance preference. Use up to four times daily as directed.   chlorthalidone (HYGROTON) 25 MG tablet TAKE 1 TABLET(25 MG) BY MOUTH DAILY   glucose blood ([employer] GLUCOSE METER TEST STRIPS) test strip Use as instructed   losartan (COZAAR) 100 MG tablet TAKE 1 TABLET(100 MG) BY MOUTH DAILY   metFORMIN (GLUCOPHAGE) 500 MG tablet TAKE 2 TABLETS(1000 MG) BY MOUTH TWICE DAILY WITH A MEAL    No Known Allergies  SOCIAL HISTORY/FAMILY HISTORY   Reviewed in Epic:   Social History   Tobacco Use   Smoking status: Never   Smokeless tobacco: Never  Vaping Use   Vaping Use: Never used  Substance Use Topics   Alcohol use: Yes    Alcohol/week: 2.0 standard drinks of alcohol    Types: 2 Standard drinks or equivalent per week   Drug use: No   Social History   Social History Narrative   Not on file   Family History  Problem Relation Age of Onset   Heart disease Mother    Cancer Father   - Mother - CAD - PCI in 53s; died @ 30 (Alzheimer's) - Father - died 52 - cancer (brain) 2 Sisters - 1 died from infection/sepsis (had DM); other sister healthy  OBJCTIVE -PE, EKG, labs   Wt Readings from Last 3 Encounters:  06/25/22 247 lb (112 kg)  04/24/22 254 lb (115.2 kg)  01/30/22 247 lb 14.4 oz (112.4 kg)    Physical Exam: BP (!) 140/94 (BP Location: Right Arm, Patient Position: Sitting, Cuff Size: Large)   Pulse (!) 106   Ht 6' (1.829 m)   Wt 247 lb (112 kg)   SpO2 99%   BMI 33.50 kg/m  Physical Exam Vitals reviewed.  Constitutional:      General: He is not in acute distress.    Appearance: He is obese. He is not ill-appearing or toxic-appearing.     Comments: Morbidly obese but well-groomed.  HENT:      Head: Normocephalic and atraumatic.  Eyes:     Extraocular Movements: Extraocular movements intact.     Pupils: Pupils are equal, round, and reactive to light.  Neck:     Vascular: No carotid bruit or JVD.  Cardiovascular:     Rate and Rhythm: Regular rhythm. Tachycardia present. No extrasystoles are present.    Chest Wall: PMI is not displaced (Unable to palpate).  Pulses: Normal pulses.     Heart sounds: S1 normal and S2 normal. Heart sounds are distant. No murmur heard.    No friction rub. No gallop.  Pulmonary:     Effort: Pulmonary effort is normal. No respiratory distress.     Breath sounds: Normal breath sounds. No wheezing, rhonchi or rales.  Chest:     Chest wall: No tenderness.  Abdominal:     General: Bowel sounds are normal. There is no distension.     Palpations: Abdomen is soft. There is no mass.     Tenderness: There is no abdominal tenderness.     Comments: Obese.  Unable assess HSM  Musculoskeletal:        General: No swelling. Normal range of motion.     Cervical back: Normal range of motion and neck supple.  Skin:    General: Skin is warm.     Comments: Somewhat sweaty  Neurological:     General: No focal deficit present.     Mental Status: He is alert and oriented to person, place, and time.     Gait: Gait normal.  Psychiatric:        Mood and Affect: Mood normal.        Behavior: Behavior normal.        Thought Content: Thought content normal.        Judgment: Judgment normal.      Adult ECG Report  Rate: 106 ;  Rhythm: sinus tachycardia and left axis deviation ; poor R wave progression but otherwise normal will discharge, intervals and durations.  Narrative Interpretation: Relatively stable.  05/16/2021: ER visit for SVT in setting of hyperglycemia.-Reviewed.   Recent Labs: Reviewed Lab Results  Component Value Date   CHOL 132 08/07/2021   HDL 50 08/07/2021   LDLCALC 51 08/07/2021   TRIG 187 (H) 08/07/2021   CHOLHDL 2.6 08/07/2021   Lab  Results  Component Value Date   CREATININE 1.16 04/24/2022   BUN 20 04/24/2022   NA 140 04/24/2022   K 4.5 04/24/2022   CL 102 04/24/2022   CO2 19 (L) 04/24/2022      Latest Ref Rng & Units 04/24/2022    9:09 AM 05/16/2021    5:27 PM 05/15/2021    8:01 AM  CBC  WBC 3.4 - 10.8 x10E3/uL 7.2  15.2  10.9   Hemoglobin 13.0 - 17.7 g/dL 16.1  09.6  04.5   Hematocrit 37.5 - 51.0 % 39.5  48.7  50.8   Platelets 150 - 450 x10E3/uL 216  344  296     Lab Results  Component Value Date   HGBA1C 6.7 (H) 04/24/2022   No results found for: "TSH"  ================================================== I spent a total of 26 minutes with the patient spent in direct patient consultation.  Additional time spent with chart review  / charting (studies, outside notes, etc): 20 min Total Time: 46 min  Current medicines are reviewed at length with the patient today.  (+/- concerns) N/A  Notice: This dictation was prepared with Dragon dictation along with smart phrase technology. Any transcriptional errors that result from this process are unintentional and may not be corrected upon review.   Studies Ordered:  Orders Placed This Encounter  Procedures   EKG 12-Lead   Meds ordered this encounter  Medications   metoprolol succinate (TOPROL XL) 25 MG 24 hr tablet    Sig: Take 1 tablet (25 mg total) by mouth daily.    Dispense:  90  tablet    Refill:  2    Patient Instructions / Medication Changes & Studies & Tests Ordered   Patient Instructions  Medication Instructions:  Your physician has recommended you make the following change in your medication:  START - metoprolol succinate (TOPROL XL) 25 MG 24 hr tablet - Take 1 tablet (25 mg total) by mouth daily. **TAKE 1/2 TABLET FOR FIRST 2 WEEKS**  *If you need a refill on your cardiac medications before your next appointment, please call your pharmacy*  Lab Work: -None ordered If you have labs (blood work) drawn today and your tests are completely  normal, you will receive your results only by: MyChart Message (if you have MyChart) OR A paper copy in the mail If you have any lab test that is abnormal or we need to change your treatment, we will call you to review the results.  Testing/Procedures: -None ordered  Follow-Up: At [facility], you and your health needs are our priority.  As part of our continuing mission to provide you with exceptional heart care, we have created designated Provider Care Teams.  These Care Teams include your primary Cardiologist (physician) and Advanced Practice Providers (APPs -  Physician Assistants and Nurse Practitioners) who all work together to provide you with the care you need, when you need it.  We recommend signing up for the patient portal called "MyChart".  Sign up information is provided on this After Visit Summary.  MyChart is used to connect with patients for Virtual Visits (Telemedicine).  Patients are able to view lab/test results, encounter notes, upcoming appointments, etc.  Non-urgent messages can be sent to your provider as well.   To learn more about what you can do with MyChart, go to [email or web address].    Your next appointment:   4 - 6 month(s)  Provider:   You may see Bryan Lemma, MD or one of the following Advanced Practice Providers on your designated Care Team:   Nicolasa Ducking, NP Eula Listen, PA-C Cadence Fransico Michael, PA-C Charlsie Quest, NP    Other Instructions -None      Marykay Lex, MD, MS Bryan Lemma, M.D., M.S. Interventional Cardiologist  [facility]   [address]; [address] [place], [place]  [address] [phone]           Fax [phone]    Thank you for choosing [facility] in [place]!!

## 2022-06-26 DIAGNOSIS — E8881 Metabolic syndrome: Secondary | ICD-10-CM | POA: Insufficient documentation

## 2022-06-26 NOTE — Assessment & Plan Note (Signed)
BP still elevated today on max dose losartan 100 mg and amlodipine 10 mg.  We do have room to titrate up chlorthalidone, but with him having sinus tachycardia with history of SVT, would benefit from being on beta-blocker.  Plan: Start Toprol 25 mg daily.  Titrate up after 2 weeks from 1/2 tablet to full tablet.  Anticipate that this dose will probably be higher going forward in the future, can be titrated up by PCP or APP here in follow-up.

## 2022-06-26 NOTE — Assessment & Plan Note (Signed)
No further chest pain episodes 

## 2022-06-26 NOTE — Assessment & Plan Note (Signed)
Seems to be pretty well-controlled.  I suspect this was related to hyperglycemia and dehydration.  We talked about avoiding hyperglycemia and other triggers.  Discussed vagal maneuvers, maintaining adequate hydration.  Start Toprol 25 mg daily.

## 2022-06-26 NOTE — Assessment & Plan Note (Signed)
LDL is pretty well-controlled but triglycerides were still little bit elevated back in June 2023.  Hopefully as glycemic control improves, the triglyceride levels will improve.  But with hypertension, hypercholesterolemia, and obesity meets the diagnosis of metabolic syndrome.  Agree with continuing Lipitor and Glucophage, complete but would strongly consider GLP-1 agonist.  Discussed importance of dietary modification and exercise.

## 2022-06-26 NOTE — Assessment & Plan Note (Signed)
Criteria noted from obesity, hypertriglyceridemia and diabetes.  This places him at higher risk for cardiovascular disease.  We discussed whether or not he would benefit from Coronary Calcium Score and, however he is already on a statin with LDL of 51.  Glycemic control has improved, but most recent A1c was still at 6.7.  Would still benefit from improved glycemic control as well as weight loss.  Discussed importance of dietary modification and exercise.  Would also recommend GLP-1 agonist depending on next evaluation.  Not sure that we have a benefit at this point of Coronary Calcium Score evaluation because he is already on aggressive therapy.

## 2022-08-04 ENCOUNTER — Telehealth: Payer: BC Managed Care – PPO | Admitting: Nurse Practitioner

## 2022-08-04 DIAGNOSIS — K047 Periapical abscess without sinus: Secondary | ICD-10-CM

## 2022-08-05 MED ORDER — PENICILLIN V POTASSIUM 500 MG PO TABS
500.0000 mg | ORAL_TABLET | Freq: Three times a day (TID) | ORAL | 0 refills | Status: AC
Start: 2022-08-05 — End: 2022-08-12

## 2022-08-05 NOTE — Progress Notes (Signed)
E-Visit for Dental Pain  We are sorry that you are not feeling well.  Here is how we plan to help!  Based on what you have shared with me in the questionnaire, it sounds like you have an infection under one of your teeth   Pen VK 500mg 3 times a day for 7 days  It is imperative that you see a dentist within 10 days of this eVisit to determine the cause of the dental pain and be sure it is adequately treated  A toothache or tooth pain is caused when the nerve in the root of a tooth or surrounding a tooth is irritated. Dental (tooth) infection, decay, injury, or loss of a tooth are the most common causes of dental pain. Pain may also occur after an extraction (tooth is pulled out). Pain sometimes originates from other areas and radiates to the jaw, thus appearing to be tooth pain.Bacteria growing inside your mouth can contribute to gum disease and dental decay, both of which can cause pain. A toothache occurs from inflammation of the central portion of the tooth called pulp. The pulp contains nerve endings that are very sensitive to pain. Inflammation to the pulp or pulpitis may be caused by dental cavities, trauma, and infection.    HOME CARE:   For toothaches: Over-the-counter pain medications such as acetaminophen or ibuprofen may be used. Take these as directed on the package while you arrange for a dental appointment. Avoid very cold or hot foods, because they may make the pain worse. You may get relief from biting on a cotton ball soaked in oil of cloves. You can get oil of cloves at most drug stores.  For jaw pain:  Aspirin may be helpful for problems in the joint of the jaw in adults. If pain happens every time you open your mouth widely, the temporomandibular joint (TMJ) may be the source of the pain. Yawning or taking a large bite of food may worsen the pain. An appointment with your doctor or dentist will help you find the cause.     GET HELP RIGHT AWAY IF:  You have a high fever  or chills If you have had a recent head or face injury and develop headache, light headedness, nausea, vomiting, or other symptoms that concern you after an injury to your face or mouth, you could have a more serious injury in addition to your dental injury. A facial rash associated with a toothache: This condition may improve with medication. Contact your doctor for them to decide what is appropriate. Any jaw pain occurring with chest pain: Although jaw pain is most commonly caused by dental disease, it is sometimes referred pain from other areas. People with heart disease, especially people who have had stents placed, people with diabetes, or those who have had heart surgery may have jaw pain as a symptom of heart attack or angina. If your jaw or tooth pain is associated with lightheadedness, sweating, or shortness of breath, you should see a doctor as soon as possible. Trouble swallowing or excessive pain or bleeding from gums: If you have a history of a weakened immune system, diabetes, or steroid use, you may be more susceptible to infections. Infections can often be more severe and extensive or caused by unusual organisms. Dental and gum infections in people with these conditions may require more aggressive treatment. An abscess may need draining or IV antibiotics, for example.  MAKE SURE YOU   Understand these instructions. Will watch your condition. Will   get help right away if you are not doing well or get worse.  Thank you for choosing an e-visit.  Your e-visit answers were reviewed by a board certified advanced clinical practitioner to complete your personal care plan. Depending upon the condition, your plan could have included both over the counter or prescription medications.  Please review your pharmacy choice. Make sure the pharmacy is open so you can pick up prescription now. If there is a problem, you may contact your provider through MyChart messaging and have the prescription routed  to another pharmacy.  Your safety is important to us. If you have drug allergies check your prescription carefully.   For the next 24 hours you can use MyChart to ask questions about today's visit, request a non-urgent call back, or ask for a work or school excuse. You will get an email in the next two days asking about your experience. I hope that your e-visit has been valuable and will speed your recovery.   Meds ordered this encounter  Medications   penicillin v potassium (VEETID) 500 MG tablet    Sig: Take 1 tablet (500 mg total) by mouth 3 (three) times daily for 7 days.    Dispense:  21 tablet    Refill:  0    I spent approximately 5 minutes reviewing the patient's history, current symptoms and coordinating their care today.   

## 2022-08-06 ENCOUNTER — Ambulatory Visit: Payer: BC Managed Care – PPO | Admitting: Physician Assistant

## 2022-08-06 ENCOUNTER — Other Ambulatory Visit: Payer: Self-pay | Admitting: Physician Assistant

## 2022-08-06 DIAGNOSIS — E1169 Type 2 diabetes mellitus with other specified complication: Secondary | ICD-10-CM

## 2022-08-19 NOTE — Progress Notes (Unsigned)
Complete physical exam   Patient: Jerome Graves   DOB: October 23, 1965   57 y.o. Male  MRN: [id number] Visit Date: 08/20/2022  Today's healthcare provider: Alfredia Ferguson, PA-C   No chief complaint on file.  Subjective    Jerome Graves is a 57 y.o. male who presents today for a complete physical exam.  He reports consuming a {diet types:17450} diet. {Exercise:19826} He generally feels {well/fairly well/poorly:18703}. He reports sleeping {well/fairly well/poorly:18703}. He {does/does not:200015} have additional problems to discuss today.  HPI  ***  Past Medical History:  Diagnosis Date   GERD (gastroesophageal reflux disease)    Gross hematuria    History of kidney stones    Hypertension    Renal calculus, right    Right flank pain    Past Surgical History:  Procedure Laterality Date   CYSTOSCOPY W/ RETROGRADES Left 05/01/2021   Procedure: CYSTOSCOPY WITH RETROGRADE PYELOGRAM;  Surgeon: Sondra Come, MD;  Location: [facility];  Service: Urology;  Laterality: Left;   CYSTOSCOPY WITH RETROGRADE PYELOGRAM, URETEROSCOPY AND STENT PLACEMENT Bilateral 09/13/2013   Procedure: CYSTOSCOPY WITH BILATERAL RETROGRADE PYELOGRAM, RIGHT URETEROSCOPY AND STENT PLACEMENT;  Surgeon: Sebastian Ache, MD;  [facility];  Service: Urology;  Laterality: Bilateral;   CYSTOSCOPY/URETEROSCOPY/HOLMIUM LASER/STENT PLACEMENT Left 05/01/2021   Procedure: CYSTOSCOPY/URETEROSCOPY/HOLMIUM LASER/STENT PLACEMENT;  Surgeon: Sondra Come, MD;  Location: [facility];  Service: Urology;  Laterality: Left;   EXTRACORPOREAL SHOCK WAVE LITHOTRIPSY Left 05/01/2021   Procedure: EXTRACORPOREAL SHOCK WAVE LITHOTRIPSY (ESWL);  Surgeon: Sondra Come, MD;  Location: [facility];  Service: Urology;  Laterality: Left;   HOLMIUM LASER APPLICATION Right 09/13/2013   Procedure: HOLMIUM LASER APPLICATION;  Surgeon: Sebastian Ache, MD;  Location: [facility];  Service: Urology;  Laterality:  Right;   NEGATIVE SLEEP STUDY     2003 -- PER PT   PERCUTANEOUS NEPHROLITHOTRIPSY Left 2010   PINNING RIGHT RING FINGER FX  YRS AGO   Social History   Socioeconomic History   Marital status: Married    Spouse name: Not on file   Number of children: Not on file   Years of education: Not on file   Highest education level: Not on file  Occupational History   Not on file  Tobacco Use   Smoking status: Never   Smokeless tobacco: Never  Vaping Use   Vaping Use: Never used  Substance and Sexual Activity   Alcohol use: Yes    Alcohol/week: 2.0 standard drinks of alcohol    Types: 2 Standard drinks or equivalent per week   Drug use: No   Sexual activity: Not on file  Other Topics Concern   Not on file  Social History Narrative   Not on file   Social Determinants of Health   Financial Resource Strain: Not on file  Food Insecurity: Not on file  Transportation Needs: Not on file  Physical Activity: Not on file  Stress: Not on file  Social Connections: Not on file  Intimate Partner Violence: Not on file   Family Status  Relation Name Status   Mother  Deceased   Father  Deceased   Sister  Deceased   Sister  Alive   Daughter  Alive   Daughter  Alive   Family History  Problem Relation Age of Onset   Heart disease Mother    Cancer Father    No Known Allergies  Patient Care Team: Alfredia Ferguson, PA-C as PCP - General (Physician Assistant)  Medications: Outpatient Medications Prior to Visit  Medication Sig   albuterol (VENTOLIN HFA) 108 (90 Base) MCG/ACT inhaler Inhale 2 puffs into the lungs every 6 (six) hours as needed for wheezing or shortness of breath.   amLODipine (NORVASC) 10 MG tablet TAKE 1 TABLET(10 MG) BY MOUTH DAILY   atorvastatin (LIPITOR) 20 MG tablet TAKE 1 TABLET(20 MG) BY MOUTH DAILY   blood glucose meter kit and supplies KIT Dispense based on patient and insurance preference. Use up to four times daily as directed.   chlorthalidone (HYGROTON) 25 MG  tablet TAKE 1 TABLET(25 MG) BY MOUTH DAILY   glucose blood ([employer] GLUCOSE METER TEST STRIPS) test strip Use as instructed   losartan (COZAAR) 100 MG tablet TAKE 1 TABLET(100 MG) BY MOUTH DAILY   metFORMIN (GLUCOPHAGE) 500 MG tablet TAKE 2 TABLETS(1000 MG) BY MOUTH TWICE DAILY WITH A MEAL   metoprolol succinate (TOPROL XL) 25 MG 24 hr tablet Take 1 tablet (25 mg total) by mouth daily.   No facility-administered medications prior to visit.    Review of Systems  {Labs  Heme  Chem  Endocrine  Serology  Results Review (optional):23779}  Objective    There were no vitals taken for this visit. {Show previous Eliz Nigg signs (optional):23777}   Physical Exam  ***  Last depression screening scores    12/31/2021    8:43 AM 11/12/2021    8:20 AM 08/07/2021    8:19 AM  PHQ 2/9 Scores  PHQ - 2 Score 0 0 0  PHQ- 9 Score 0 0 0   Last fall risk screening    12/31/2021    8:43 AM  Fall Risk   Falls in the past year? 0  Number falls in past yr: 0  Injury with Fall? 0  Risk for fall due to : No Fall Risks  Follow up Falls evaluation completed   Last Audit-C alcohol use screening    12/31/2021    8:43 AM  Alcohol Use Disorder Test (AUDIT)  1. How often do you have a drink containing alcohol? 4  2. How many drinks containing alcohol do you have on a typical day when you are drinking? 0  3. How often do you have six or more drinks on one occasion? 2  AUDIT-C Score 6   A score of 3 or more in women, and 4 or more in men indicates increased risk for alcohol abuse, EXCEPT if all of the points are from question 1   No results found for any visits on 08/20/22.  Assessment & Plan    Routine Health Maintenance and Physical Exam  Exercise Activities and Dietary recommendations  Goals   None     Immunization History  Administered Date(s) Administered   Tdap 11/12/2021    Health Maintenance  Topic Date Due   OPHTHALMOLOGY EXAM  Never done   Hepatitis C Screening  Never done    Colonoscopy  Never done   Zoster Vaccines- Shingrix (1 of 2) Never done   Diabetic kidney evaluation - Urine ACR  08/08/2022   FOOT EXAM  08/08/2022   INFLUENZA VACCINE  10/01/2022   HEMOGLOBIN A1C  10/23/2022   Diabetic kidney evaluation - eGFR measurement  04/25/2023   DTaP/Tdap/Td (2 - Td or Tdap) 11/13/2031   HIV Screening  Completed   HPV VACCINES  Aged Out   COVID-19 Vaccine  Discontinued    Discussed health benefits of physical activity, and encouraged him to engage in regular exercise appropriate for his  age and condition.  ***  No follow-ups on file.     {provider attestation***:1}   Alfredia Ferguson, PA-C  [facility] [phone] (phone) [phone] (fax)  [facility]

## 2022-08-20 ENCOUNTER — Encounter: Payer: Self-pay | Admitting: Physician Assistant

## 2022-08-20 ENCOUNTER — Ambulatory Visit (INDEPENDENT_AMBULATORY_CARE_PROVIDER_SITE_OTHER): Payer: BC Managed Care – PPO | Admitting: Physician Assistant

## 2022-08-20 VITALS — BP 138/92 | HR 96 | Temp 98.0°F | Resp 15 | Ht 72.0 in | Wt 256.5 lb

## 2022-08-20 DIAGNOSIS — E785 Hyperlipidemia, unspecified: Secondary | ICD-10-CM | POA: Diagnosis not present

## 2022-08-20 DIAGNOSIS — I471 Supraventricular tachycardia, unspecified: Secondary | ICD-10-CM | POA: Diagnosis not present

## 2022-08-20 DIAGNOSIS — E1159 Type 2 diabetes mellitus with other circulatory complications: Secondary | ICD-10-CM | POA: Diagnosis not present

## 2022-08-20 DIAGNOSIS — E119 Type 2 diabetes mellitus without complications: Secondary | ICD-10-CM | POA: Diagnosis not present

## 2022-08-20 DIAGNOSIS — E1169 Type 2 diabetes mellitus with other specified complication: Secondary | ICD-10-CM | POA: Diagnosis not present

## 2022-08-20 DIAGNOSIS — Z1211 Encounter for screening for malignant neoplasm of colon: Secondary | ICD-10-CM

## 2022-08-20 DIAGNOSIS — I152 Hypertension secondary to endocrine disorders: Secondary | ICD-10-CM

## 2022-08-20 MED ORDER — SEMAGLUTIDE(0.25 OR 0.5MG/DOS) 2 MG/3ML ~~LOC~~ SOPN
PEN_INJECTOR | SUBCUTANEOUS | 3 refills | Status: DC
Start: 2022-08-20 — End: 2023-01-05

## 2022-08-20 NOTE — Assessment & Plan Note (Signed)
Moderate control  Manages with losartan 100 mg chlorthalidone 25 mg and amlodipine 10 mg , recent addition of toprol 25 mg.   F/b cardiology

## 2022-08-20 NOTE — Assessment & Plan Note (Signed)
Pt denies recurrent episodes

## 2022-08-20 NOTE — Assessment & Plan Note (Signed)
Manages with lipitor 20 mg  Repeat fasting lipids today The 10-year ASCVD risk score (Arnett DK, et al., 2019) is: 9.7%

## 2022-08-20 NOTE — Assessment & Plan Note (Signed)
Last A1c 6.7% ordered today Manages with metformin 1000 mg bid Given desire for weight loss Rec starting ozempic 0.25 mg x 4 weeks, then 0.5 mg  F/b 4 mo   Needs foot exam next visit. Uacr ordered. On statin, acei

## 2022-08-21 LAB — LIPID PANEL
Chol/HDL Ratio: 2.8 ratio (ref 0.0–5.0)
Cholesterol, Total: 139 mg/dL (ref 100–199)
HDL: 50 mg/dL (ref >39)
LDL Chol Calc (NIH): 56 mg/dL (ref 0–99)
Triglycerides: 204 mg/dL — ABNORMAL HIGH (ref 0–149)
VLDL Cholesterol Cal: 33 mg/dL (ref 5–40)

## 2022-08-21 LAB — COMPREHENSIVE METABOLIC PANEL
ALT: 23 IU/L (ref 0–44)
AST: 18 IU/L (ref 0–40)
Albumin: 4.4 g/dL (ref 3.8–4.9)
Alkaline Phosphatase: 97 IU/L (ref 44–121)
BUN/Creatinine Ratio: 17 (ref 9–20)
BUN: 24 mg/dL (ref 6–24)
Bilirubin Total: 1.2 mg/dL (ref 0.0–1.2)
CO2: 20 mmol/L (ref 20–29)
Calcium: 9.6 mg/dL (ref 8.7–10.2)
Chloride: 107 mmol/L — ABNORMAL HIGH (ref 96–106)
Creatinine, Ser: 1.41 mg/dL — ABNORMAL HIGH (ref 0.76–1.27)
Globulin, Total: 2.6 g/dL (ref 1.5–4.5)
Glucose: 130 mg/dL — ABNORMAL HIGH (ref 70–99)
Potassium: 4.7 mmol/L (ref 3.5–5.2)
Sodium: 141 mmol/L (ref 134–144)
Total Protein: 7 g/dL (ref 6.0–8.5)
eGFR: 58 mL/min/{1.73_m2} — ABNORMAL LOW (ref >59)

## 2022-08-21 LAB — MICROALBUMIN / CREATININE URINE RATIO
Creatinine, Urine: 96.2 mg/dL
Microalb/Creat Ratio: 19 mg/g creat (ref 0–29)
Microalbumin, Urine: 18.2 ug/mL

## 2022-08-21 LAB — HEMOGLOBIN A1C
Est. average glucose Bld gHb Est-mCnc: 160 mg/dL
Hgb A1c MFr Bld: 7.2 % — ABNORMAL HIGH (ref 4.8–5.6)

## 2022-08-26 ENCOUNTER — Encounter: Payer: Self-pay | Admitting: *Deleted

## 2022-09-09 ENCOUNTER — Telehealth: Payer: Self-pay | Admitting: Physician Assistant

## 2022-09-09 NOTE — Telephone Encounter (Signed)
Unable to start PA for ozempic. Please confirm insurance information-- cover my meds saying no coverage

## 2022-09-23 NOTE — Telephone Encounter (Signed)
Patient has picked up prescription without a problem. Confirmed with [facility] pharmacy.

## 2022-09-23 NOTE — Telephone Encounter (Signed)
Call attempted. NA. Mailbox is full.

## 2022-11-05 ENCOUNTER — Other Ambulatory Visit: Payer: Self-pay | Admitting: Physician Assistant

## 2022-11-05 DIAGNOSIS — E1169 Type 2 diabetes mellitus with other specified complication: Secondary | ICD-10-CM

## 2022-12-19 ENCOUNTER — Other Ambulatory Visit: Payer: Self-pay | Admitting: Physician Assistant

## 2022-12-19 DIAGNOSIS — E1165 Type 2 diabetes mellitus with hyperglycemia: Secondary | ICD-10-CM

## 2022-12-19 DIAGNOSIS — I10 Essential (primary) hypertension: Secondary | ICD-10-CM

## 2022-12-27 ENCOUNTER — Other Ambulatory Visit: Payer: Self-pay | Admitting: Physician Assistant

## 2022-12-27 DIAGNOSIS — I10 Essential (primary) hypertension: Secondary | ICD-10-CM

## 2022-12-27 DIAGNOSIS — E1165 Type 2 diabetes mellitus with hyperglycemia: Secondary | ICD-10-CM

## 2022-12-29 NOTE — Telephone Encounter (Signed)
Requested medication (s) are due for refill today: yes  Requested medication (s) are on the active medication list: yes  Last refill:  06/22/22  Future visit scheduled: no  Notes to clinic: Lasted refilled by provider no longer at practice, routing for review     Requested Prescriptions  Pending Prescriptions Disp Refills   chlorthalidone (HYGROTON) 25 MG tablet [Pharmacy Med Name: CHLORTHALIDONE 25MG  TABLETS] 30 tablet     Sig: TAKE 1 TABLET(25 MG) BY MOUTH DAILY     Cardiovascular: Diuretics - Thiazide Failed - 12/27/2022  5:42 PM      Failed - Cr in normal range and within 180 days    Creatinine  Date Value Ref Range Status  08/08/2013 1.10 0.60 - 1.30 mg/dL Final   Creatinine, Ser  Date Value Ref Range Status  08/20/2022 1.41 (H) 0.76 - 1.27 mg/dL Final         Failed - Last BP in normal range    BP Readings from Last 1 Encounters:  08/20/22 (!) 138/92         Passed - K in normal range and within 180 days    Potassium  Date Value Ref Range Status  08/20/2022 4.7 3.5 - 5.2 mmol/L Final  08/08/2013 3.5 3.5 - 5.1 mmol/L Final         Passed - Na in normal range and within 180 days    Sodium  Date Value Ref Range Status  08/20/2022 141 134 - 144 mmol/L Final  08/08/2013 139 136 - 145 mmol/L Final         Passed - Valid encounter within last 6 months    Recent Outpatient Visits           4 months ago Diabetes mellitus without complication [facility])   [facility] Alfredia Ferguson, PA-C   8 months ago Annual physical exam   [facility] Alfredia Ferguson, PA-C   11 months ago Hypertension associated with diabetes [facility])   [facility] Alfredia Ferguson, PA-C   12 months ago Hypertension associated with diabetes [facility])   [facility] Alfredia Ferguson, PA-C   1 year ago Type 2 diabetes mellitus without complication, without long-term current use of insulin [facility])   [facility] Alfredia Ferguson, PA-C       Future Appointments             In 6 days Ok Edwards, Lou Cal [facility] at [facility], [facility]

## 2023-01-04 ENCOUNTER — Ambulatory Visit (INDEPENDENT_AMBULATORY_CARE_PROVIDER_SITE_OTHER): Payer: BC Managed Care – PPO | Admitting: Physician Assistant

## 2023-01-04 ENCOUNTER — Encounter: Payer: Self-pay | Admitting: Physician Assistant

## 2023-01-04 VITALS — BP 147/98 | HR 112 | Temp 98.2°F | Ht 72.0 in | Wt 249.4 lb

## 2023-01-04 DIAGNOSIS — Z23 Encounter for immunization: Secondary | ICD-10-CM | POA: Diagnosis not present

## 2023-01-04 DIAGNOSIS — E1169 Type 2 diabetes mellitus with other specified complication: Secondary | ICD-10-CM | POA: Diagnosis not present

## 2023-01-04 DIAGNOSIS — I152 Hypertension secondary to endocrine disorders: Secondary | ICD-10-CM

## 2023-01-04 DIAGNOSIS — Z1211 Encounter for screening for malignant neoplasm of colon: Secondary | ICD-10-CM

## 2023-01-04 DIAGNOSIS — E785 Hyperlipidemia, unspecified: Secondary | ICD-10-CM

## 2023-01-04 DIAGNOSIS — E1159 Type 2 diabetes mellitus with other circulatory complications: Secondary | ICD-10-CM

## 2023-01-04 DIAGNOSIS — Z7985 Long-term (current) use of injectable non-insulin antidiabetic drugs: Secondary | ICD-10-CM

## 2023-01-04 DIAGNOSIS — Z7984 Long term (current) use of oral hypoglycemic drugs: Secondary | ICD-10-CM

## 2023-01-04 DIAGNOSIS — E119 Type 2 diabetes mellitus without complications: Secondary | ICD-10-CM

## 2023-01-04 MED ORDER — ATORVASTATIN CALCIUM 20 MG PO TABS
20.0000 mg | ORAL_TABLET | Freq: Every day | ORAL | 0 refills | Status: DC
Start: 2023-01-04 — End: 2023-01-04

## 2023-01-04 MED ORDER — ATORVASTATIN CALCIUM 20 MG PO TABS: 20.0000 mg | ORAL_TABLET | Freq: Every day | ORAL | 3 refills | Status: DC

## 2023-01-04 MED ORDER — AMLODIPINE BESYLATE 10 MG PO TABS: 10.0000 mg | ORAL_TABLET | Freq: Every day | ORAL | 3 refills | Status: DC

## 2023-01-04 MED ORDER — CHLORTHALIDONE 25 MG PO TABS: 25.0000 mg | ORAL_TABLET | Freq: Every day | ORAL | 3 refills | Status: DC

## 2023-01-04 NOTE — Assessment & Plan Note (Signed)
Continue lipitor 20 mg  LDL at goal

## 2023-01-04 NOTE — Progress Notes (Signed)
Established patient visit   Patient: Jerome Graves   DOB: 14-Jan-1966   57 y.o. Male  MRN: [id number] Visit Date: 01/04/2023  Today's healthcare provider: Alfredia Ferguson, PA-C   Cc. Chronic care f/u  Subjective     Pt was an established patient of mine at [facility], this is his first appointment at [facility].  He presents today for a chronic care follow up, blood pressure, diabetes, weight. Pt reports tolerating ozempic well, but does not appreciate and appetite suppression or weight changes.  He does not consistently check BP at home.   Medications: Outpatient Medications Prior to Visit  Medication Sig   albuterol (VENTOLIN HFA) 108 (90 Base) MCG/ACT inhaler Inhale 2 puffs into the lungs every 6 (six) hours as needed for wheezing or shortness of breath.   blood glucose meter kit and supplies KIT Dispense based on patient and insurance preference. Use up to four times daily as directed.   glucose blood ([employer] GLUCOSE METER TEST STRIPS) test strip Use as instructed   losartan (COZAAR) 100 MG tablet TAKE 1 TABLET(100 MG) BY MOUTH DAILY   metFORMIN (GLUCOPHAGE) 500 MG tablet TAKE 2 TABLETS(1000 MG) BY MOUTH TWICE DAILY WITH A MEAL   metoprolol succinate (TOPROL XL) 25 MG 24 hr tablet Take 1 tablet (25 mg total) by mouth daily.   Semaglutide,0.25 or 0.5MG /DOS, 2 MG/3ML SOPN Inject 0.25 weekly for four weeks and then increase to 0.5 mg weekly   [DISCONTINUED] amLODipine (NORVASC) 10 MG tablet TAKE 1 TABLET(10 MG) BY MOUTH DAILY   [DISCONTINUED] atorvastatin (LIPITOR) 20 MG tablet TAKE 1 TABLET(20 MG) BY MOUTH DAILY   [DISCONTINUED] chlorthalidone (HYGROTON) 25 MG tablet TAKE 1 TABLET(25 MG) BY MOUTH DAILY   No facility-administered medications prior to visit.    Review of Systems  Constitutional:  Negative for fatigue and fever.  Respiratory:  Negative for cough and shortness of breath.   Cardiovascular:  Negative for chest pain, palpitations and  leg swelling.  Neurological:  Negative for dizziness and headaches.       Objective    BP (!) 147/98   Pulse (!) 112   Temp 98.2 F (36.8 C) (Oral)   Ht 6' (1.829 m)   Wt 249 lb 6 oz (113.1 kg)   SpO2 98%   BMI 33.82 kg/m    Physical Exam Constitutional:      General: He is awake.     Appearance: He is well-developed.  HENT:     Head: Normocephalic.  Eyes:     Conjunctiva/sclera: Conjunctivae normal.  Cardiovascular:     Rate and Rhythm: Normal rate and regular rhythm.     Pulses:          Dorsalis pedis pulses are 3+ on the right side and 3+ on the left side.       Posterior tibial pulses are 2+ on the right side and 2+ on the left side.     Heart sounds: Normal heart sounds.  Pulmonary:     Effort: Pulmonary effort is normal.     Breath sounds: Normal breath sounds.  Feet:     Right foot:     Protective Sensation: 4 sites tested.  4 sites sensed.     Skin integrity: Skin integrity normal.     Toenail Condition: Right toenails are normal.     Left foot:     Protective Sensation: 4 sites tested.  4 sites sensed.     Skin  integrity: Skin integrity normal.     Toenail Condition: Left toenails are normal.     Comments: Some mild hyperpigmentation to the ankles/tops of feet Skin:    General: Skin is warm.  Neurological:     Mental Status: He is alert and oriented to person, place, and time.  Psychiatric:        Attention and Perception: Attention normal.        Mood and Affect: Mood normal.        Speech: Speech normal.        Behavior: Behavior is cooperative.      No results found for any visits on 01/04/23.  Assessment & Plan    Diabetes mellitus without complication (HCC) Assessment & Plan: Last A1c elevated at 7.2% Cont metformin 1000 mg bid  I had added ozempic to titrate up to 0.5 mg, pt tolerating well  Will repeat A1C and likely increase ozempic to 1 mg  Foot exam normal Due for optho Uacr utd, on statin, arb.  F/u 4 mo  Orders: -      Hemoglobin A1c -     Comprehensive metabolic panel  Hyperlipidemia associated with type 2 diabetes mellitus (HCC) Assessment & Plan: Continue lipitor 20 mg  LDL at goal  Orders: -     Atorvastatin Calcium; Take 1 tablet (20 mg total) by mouth daily.  Dispense: 90 tablet; Refill: 3  Hypertension associated with diabetes (HCC) Assessment & Plan: Chronic, moderately controlled. Consistent elevation in office . Pt due for f/u with cardiology. Recommend he start checking at home again as he considers this white coat HTN Continue amlodipine 10 mg, chlorthalidone 25 mg, losartan 100 mg daily Ordered cmp  Orders: -     amLODIPine Besylate; Take 1 tablet (10 mg total) by mouth daily.  Dispense: 90 tablet; Refill: 3 -     Chlorthalidone; Take 1 tablet (25 mg total) by mouth daily.  Dispense: 90 tablet; Refill: 3  Colon cancer screening -     Cologuard  Immunization due -     Varicella-zoster vaccine IM     Return in about 4 months (around 05/04/2023) for DMII, hypertension.       Alfredia Ferguson, PA-C  [facility] at [facility] [phone] (phone) [phone] (fax)  [facility]

## 2023-01-04 NOTE — Assessment & Plan Note (Signed)
Chronic, moderately controlled. Consistent elevation in office . Pt due for f/u with cardiology. Recommend he start checking at home again as he considers this white coat HTN Continue amlodipine 10 mg, chlorthalidone 25 mg, losartan 100 mg daily Ordered cmp

## 2023-01-04 NOTE — Assessment & Plan Note (Signed)
Last A1c elevated at 7.2% Cont metformin 1000 mg bid  I had added ozempic to titrate up to 0.5 mg, pt tolerating well  Will repeat A1C and likely increase ozempic to 1 mg  Foot exam normal Due for optho Uacr utd, on statin, arb.  F/u 4 mo

## 2023-01-05 ENCOUNTER — Encounter: Payer: Self-pay | Admitting: Physician Assistant

## 2023-01-05 ENCOUNTER — Other Ambulatory Visit: Payer: Self-pay | Admitting: Physician Assistant

## 2023-01-05 DIAGNOSIS — E1165 Type 2 diabetes mellitus with hyperglycemia: Secondary | ICD-10-CM

## 2023-01-05 DIAGNOSIS — E119 Type 2 diabetes mellitus without complications: Secondary | ICD-10-CM

## 2023-01-05 DIAGNOSIS — N183 Chronic kidney disease, stage 3 unspecified: Secondary | ICD-10-CM | POA: Insufficient documentation

## 2023-01-05 LAB — COMPREHENSIVE METABOLIC PANEL
ALT: 25 U/L (ref 0–53)
AST: 19 U/L (ref 0–37)
Albumin: 4.5 g/dL (ref 3.5–5.2)
Alkaline Phosphatase: 76 U/L (ref 39–117)
BUN: 23 mg/dL (ref 6–23)
CO2: 25 meq/L (ref 19–32)
Calcium: 9.7 mg/dL (ref 8.4–10.5)
Chloride: 103 meq/L (ref 96–112)
Creatinine, Ser: 1.33 mg/dL (ref 0.40–1.50)
GFR: 59.6 mL/min — ABNORMAL LOW (ref >60.00)
Glucose, Bld: 112 mg/dL — ABNORMAL HIGH (ref 70–99)
Potassium: 4 meq/L (ref 3.5–5.1)
Sodium: 140 meq/L (ref 135–145)
Total Bilirubin: 2.3 mg/dL — ABNORMAL HIGH (ref 0.2–1.2)
Total Protein: 7.2 g/dL (ref 6.0–8.3)

## 2023-01-05 LAB — HEMOGLOBIN A1C: Hgb A1c MFr Bld: 6.4 % (ref 4.6–6.5)

## 2023-01-05 MED ORDER — SEMAGLUTIDE (1 MG/DOSE) 4 MG/3ML ~~LOC~~ SOPN: 1.0000 mg | PEN_INJECTOR | SUBCUTANEOUS | 1 refills | Status: DC

## 2023-01-05 MED ORDER — METFORMIN HCL 500 MG PO TABS: 1000.0000 mg | ORAL_TABLET | Freq: Two times a day (BID) | ORAL | 1 refills | Status: DC

## 2023-01-21 ENCOUNTER — Other Ambulatory Visit: Payer: Self-pay | Admitting: Family Medicine

## 2023-01-21 DIAGNOSIS — E1159 Type 2 diabetes mellitus with other circulatory complications: Secondary | ICD-10-CM

## 2023-01-22 NOTE — Telephone Encounter (Signed)
Physician not at this practice  Requested Prescriptions  Refused Prescriptions Disp Refills   chlorthalidone (HYGROTON) 25 MG tablet [Pharmacy Med Name: CHLORTHALIDONE 25MG  TABLETS] 90 tablet 3    Sig: TAKE 1 TABLET(25 MG) BY MOUTH DAILY     Cardiovascular: Diuretics - Thiazide Failed - 01/21/2023  7:33 PM      Failed - Last BP in normal range    BP Readings from Last 1 Encounters:  01/04/23 (!) 147/98         Passed - Cr in normal range and within 180 days    Creatinine  Date Value Ref Range Status  08/08/2013 1.10 0.60 - 1.30 mg/dL Final   Creatinine, Ser  Date Value Ref Range Status  01/04/2023 1.33 0.40 - 1.50 mg/dL Final         Passed - K in normal range and within 180 days    Potassium  Date Value Ref Range Status  01/04/2023 4.0 3.5 - 5.1 mEq/L Final  08/08/2013 3.5 3.5 - 5.1 mmol/L Final         Passed - Na in normal range and within 180 days    Sodium  Date Value Ref Range Status  01/04/2023 140 135 - 145 mEq/L Final  08/20/2022 141 134 - 144 mmol/L Final  08/08/2013 139 136 - 145 mmol/L Final         Passed - Valid encounter within last 6 months    Recent Outpatient Visits           5 months ago Diabetes mellitus without complication [facility])   [place] Alfredia Ferguson, PA-C   9 months ago Annual physical exam   [place] [facility] Alfredia Ferguson, PA-C   11 months ago Hypertension associated with diabetes [facility])   [place] [facility] Alfredia Ferguson, PA-C   1 year ago Hypertension associated with diabetes [facility])   [place] [facility] Alfredia Ferguson, PA-C   1 year ago Type 2 diabetes mellitus without complication, without long-term current use of insulin [facility])   [facility] Alfredia Ferguson, [place]

## 2023-03-24 ENCOUNTER — Other Ambulatory Visit: Payer: Self-pay | Admitting: Cardiology

## 2023-03-28 ENCOUNTER — Other Ambulatory Visit: Payer: Self-pay | Admitting: Physician Assistant

## 2023-03-28 DIAGNOSIS — E119 Type 2 diabetes mellitus without complications: Secondary | ICD-10-CM

## 2023-05-13 ENCOUNTER — Other Ambulatory Visit: Payer: Self-pay | Admitting: Physician Assistant

## 2023-05-13 DIAGNOSIS — E1159 Type 2 diabetes mellitus with other circulatory complications: Secondary | ICD-10-CM

## 2023-06-11 ENCOUNTER — Other Ambulatory Visit: Payer: Self-pay | Admitting: Physician Assistant

## 2023-06-11 DIAGNOSIS — E1159 Type 2 diabetes mellitus with other circulatory complications: Secondary | ICD-10-CM

## 2023-06-25 ENCOUNTER — Encounter: Payer: Self-pay | Admitting: Cardiology

## 2023-06-25 MED ORDER — METOPROLOL SUCCINATE ER 25 MG PO TB24: 25.0000 mg | ORAL_TABLET | Freq: Every day | ORAL | 0 refills | Status: DC

## 2023-07-02 ENCOUNTER — Other Ambulatory Visit: Payer: Self-pay | Admitting: Physician Assistant

## 2023-07-02 DIAGNOSIS — E1165 Type 2 diabetes mellitus with hyperglycemia: Secondary | ICD-10-CM

## 2023-08-04 ENCOUNTER — Encounter: Payer: Self-pay | Admitting: Physician Assistant

## 2023-08-04 ENCOUNTER — Ambulatory Visit: Admitting: Physician Assistant

## 2023-08-04 VITALS — BP 150/91 | HR 108 | Ht 72.0 in | Wt 243.0 lb

## 2023-08-04 DIAGNOSIS — E1159 Type 2 diabetes mellitus with other circulatory complications: Secondary | ICD-10-CM | POA: Diagnosis not present

## 2023-08-04 DIAGNOSIS — J0100 Acute maxillary sinusitis, unspecified: Secondary | ICD-10-CM | POA: Diagnosis not present

## 2023-08-04 DIAGNOSIS — Z7984 Long term (current) use of oral hypoglycemic drugs: Secondary | ICD-10-CM

## 2023-08-04 DIAGNOSIS — I152 Hypertension secondary to endocrine disorders: Secondary | ICD-10-CM | POA: Diagnosis not present

## 2023-08-04 DIAGNOSIS — I471 Supraventricular tachycardia, unspecified: Secondary | ICD-10-CM | POA: Diagnosis not present

## 2023-08-04 DIAGNOSIS — E119 Type 2 diabetes mellitus without complications: Secondary | ICD-10-CM

## 2023-08-04 MED ORDER — LOSARTAN POTASSIUM 100 MG PO TABS: 100.0000 mg | ORAL_TABLET | Freq: Every day | ORAL | 2 refills | Status: AC

## 2023-08-04 MED ORDER — AZELASTINE HCL 0.1 % NA SOLN: 1.0000 | Freq: Two times a day (BID) | NASAL | 0 refills | Status: AC

## 2023-08-04 MED ORDER — AMOXICILLIN-POT CLAVULANATE 875-125 MG PO TABS: 1.0000 | ORAL_TABLET | Freq: Two times a day (BID) | ORAL | 0 refills | Status: AC

## 2023-08-04 NOTE — Progress Notes (Unsigned)
      Established patient visit   Patient: Jerome Graves   DOB: June 20, 1965   58 y.o. Male  MRN: [id number] Visit Date: 08/04/2023  Today's healthcare provider: Trenton Frock, PA-C   No chief complaint on file.  Subjective    -3 mo sinus pressure drainage,  Psudeo   Medications: Outpatient Medications Prior to Visit  Medication Sig   amLODipine  (NORVASC ) 10 MG tablet Take 1 tablet (10 mg total) by mouth daily.   atorvastatin  (LIPITOR) 20 MG tablet Take 1 tablet (20 mg total) by mouth daily.   blood glucose meter kit and supplies KIT Dispense based on patient and insurance preference. Use up to four times daily as directed.   chlorthalidone  (HYGROTON ) 25 MG tablet Take 1 tablet (25 mg total) by mouth daily.   glucose blood ([employer]S GLUCOSE METER TEST STRIPS) test strip Use as instructed   losartan  (COZAAR ) 100 MG tablet Take 1 tablet (100 mg total) by mouth daily. Need appointment with PCP for future refills.   metFORMIN  (GLUCOPHAGE ) 500 MG tablet TAKE 2 TABLETS(1000 MG) BY MOUTH TWICE DAILY WITH A MEAL   metoprolol  succinate (TOPROL -XL) 25 MG 24 hr tablet Take 1 tablet (25 mg total) by mouth daily. Overdue for follow up   OZEMPIC , 1 MG/DOSE, 4 MG/3ML SOPN INJECT 1 MG UNDER THE SKIN ONE DAY A WEEK   [DISCONTINUED] albuterol  (VENTOLIN  HFA) 108 (90 Base) MCG/ACT inhaler Inhale 2 puffs into the lungs every 6 (six) hours as needed for wheezing or shortness of breath.   No facility-administered medications prior to visit.    Review of Systems {Insert previous labs (optional):23779} {See past labs  Heme  Chem  Endocrine  Serology  Results Review (optional):1}   Objective    BP (!) 150/91   Pulse (!) 108   Ht 6' (1.829 m)   Wt 243 lb (110.2 kg)   BMI 32.96 kg/m  {Insert last BP/Wt (optional):23777}{See vitals history (optional):1}  Physical Exam Constitutional:      General: He is awake.     Appearance: He is well-developed.  HENT:     Head: Normocephalic.  Eyes:      Conjunctiva/sclera: Conjunctivae normal.  Cardiovascular:     Rate and Rhythm: Normal rate and regular rhythm.     Heart sounds: Normal heart sounds.  Pulmonary:     Effort: Pulmonary effort is normal.     Breath sounds: Normal breath sounds.  Skin:    General: Skin is warm.  Neurological:     Mental Status: He is alert and oriented to person, place, and time.  Psychiatric:        Attention and Perception: Attention normal.        Mood and Affect: Mood normal.        Speech: Speech normal.        Behavior: Behavior is cooperative.     ***  No results found for any visits on 08/04/23.  Assessment & Plan    There are no diagnoses linked to this encounter.  ***  No follow-ups on file.       Trenton Frock, PA-C  [facility] at [facility] [phone] (phone) [phone] (fax)  [facility]

## 2023-08-05 ENCOUNTER — Encounter: Payer: Self-pay | Admitting: Cardiology

## 2023-08-05 LAB — CBC WITH DIFFERENTIAL/PLATELET
Basophils Absolute: 0.1 10*3/uL (ref 0.0–0.1)
Basophils Relative: 1 % (ref 0.0–3.0)
Eosinophils Absolute: 0.2 10*3/uL (ref 0.0–0.7)
Eosinophils Relative: 2.7 % (ref 0.0–5.0)
HCT: 42.4 % (ref 39.0–52.0)
Hemoglobin: 14.6 g/dL (ref 13.0–17.0)
Lymphocytes Relative: 23.9 % (ref 12.0–46.0)
Lymphs Abs: 1.9 10*3/uL (ref 0.7–4.0)
MCHC: 34.3 g/dL (ref 30.0–36.0)
MCV: 90.5 fl (ref 78.0–100.0)
Monocytes Absolute: 0.7 10*3/uL (ref 0.1–1.0)
Monocytes Relative: 8.1 % (ref 3.0–12.0)
Neutro Abs: 5.2 10*3/uL (ref 1.4–7.7)
Neutrophils Relative %: 64.3 % (ref 43.0–77.0)
Platelets: 217 10*3/uL (ref 150.0–400.0)
RBC: 4.68 Mil/uL (ref 4.22–5.81)
RDW: 13.3 % (ref 11.5–15.5)
WBC: 8.1 10*3/uL (ref 4.0–10.5)

## 2023-08-05 LAB — MICROALBUMIN / CREATININE URINE RATIO
Creatinine,U: 53.7 mg/dL
Microalb Creat Ratio: 26.9 mg/g (ref 0.0–30.0)
Microalb, Ur: 1.4 mg/dL (ref 0.0–1.9)

## 2023-08-05 LAB — COMPREHENSIVE METABOLIC PANEL WITH GFR
ALT: 26 U/L (ref 0–53)
AST: 21 U/L (ref 0–37)
Albumin: 4.6 g/dL (ref 3.5–5.2)
Alkaline Phosphatase: 84 U/L (ref 39–117)
BUN: 27 mg/dL — ABNORMAL HIGH (ref 6–23)
CO2: 22 meq/L (ref 19–32)
Calcium: 9.4 mg/dL (ref 8.4–10.5)
Chloride: 101 meq/L (ref 96–112)
Creatinine, Ser: 1.41 mg/dL (ref 0.40–1.50)
GFR: 55.34 mL/min — ABNORMAL LOW (ref >60.00)
Glucose, Bld: 103 mg/dL — ABNORMAL HIGH (ref 70–99)
Potassium: 3.7 meq/L (ref 3.5–5.1)
Sodium: 138 meq/L (ref 135–145)
Total Bilirubin: 2 mg/dL — ABNORMAL HIGH (ref 0.2–1.2)
Total Protein: 7.5 g/dL (ref 6.0–8.3)

## 2023-08-05 LAB — HEMOGLOBIN A1C: Hgb A1c MFr Bld: 6.1 % (ref 4.6–6.5)

## 2023-08-06 ENCOUNTER — Ambulatory Visit: Payer: Self-pay | Admitting: Physician Assistant

## 2023-08-06 ENCOUNTER — Encounter: Payer: Self-pay | Admitting: Physician Assistant

## 2023-08-06 NOTE — Assessment & Plan Note (Signed)
 On metop 25 mg daily. Asymptomatic.

## 2023-08-06 NOTE — Assessment & Plan Note (Signed)
 Chronic, elevated in office today. 2/2 to sinusitis and sudafed use.  Cont amlodipine  10 mg, chlorthalidone  25 mg, losartan  100 mg daily. Ordering cmp

## 2023-08-06 NOTE — Assessment & Plan Note (Signed)
 Lab Results  Component Value Date   HGBA1C 6.1 08/04/2023   HGBA1C 6.4 01/04/2023   HGBA1C 7.2 (H) 08/20/2022   Cont metformin  1000 mg bid, ozempic  1 mg weekly.  Last A1c 6.1%.  Uacr ordered, foot exam utd.  Refer to optho On statin, arb. F/u 6 mo

## 2023-08-09 ENCOUNTER — Other Ambulatory Visit: Payer: Self-pay | Admitting: Physician Assistant

## 2023-08-09 DIAGNOSIS — E119 Type 2 diabetes mellitus without complications: Secondary | ICD-10-CM

## 2023-08-12 ENCOUNTER — Ambulatory Visit: Admitting: Cardiology

## 2023-09-05 IMAGING — CR DG ABDOMEN 1V
2 series · 2 of 2 positions shown · non-contrast
Comparison: Abdominal x-ray dated April 29, 2021.

CLINICAL DATA: Preoperative studies.  Kidney stones.

EXAM:
ABDOMEN - 1 VIEW

[abdomen kub (1 of 2)]
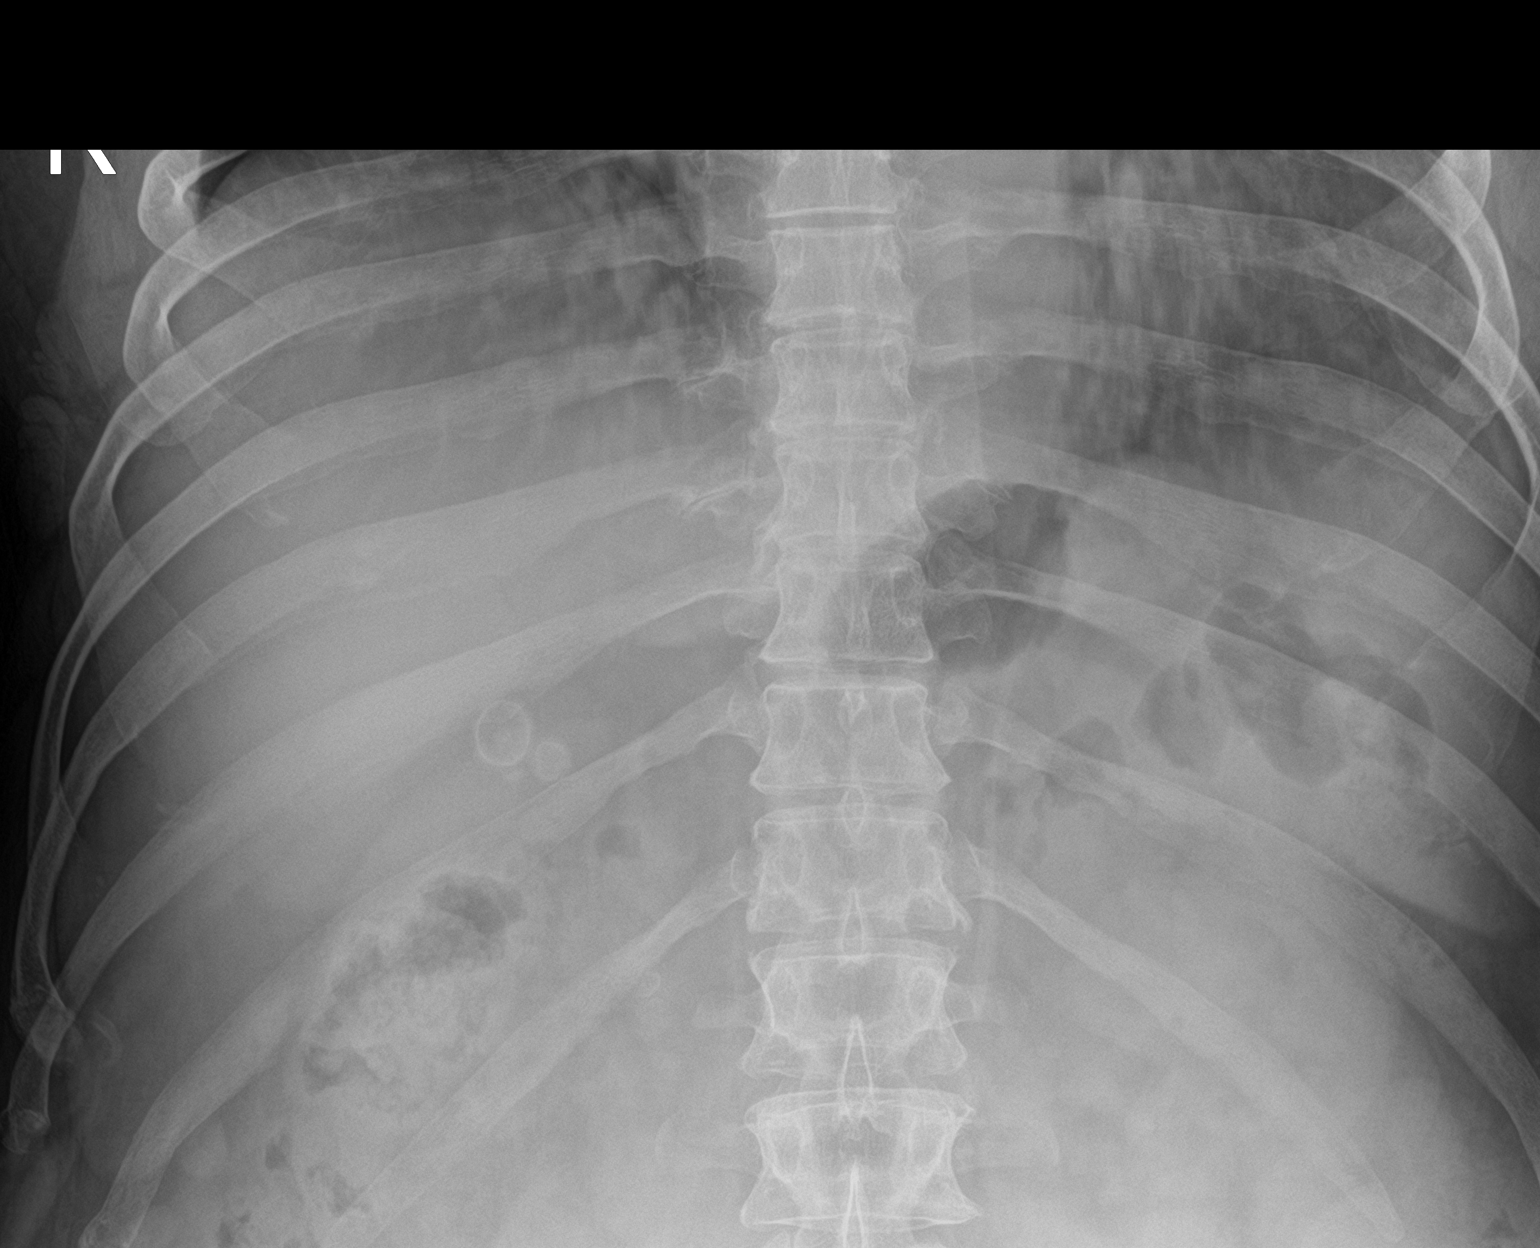

[abdomen kub (2 of 2)]
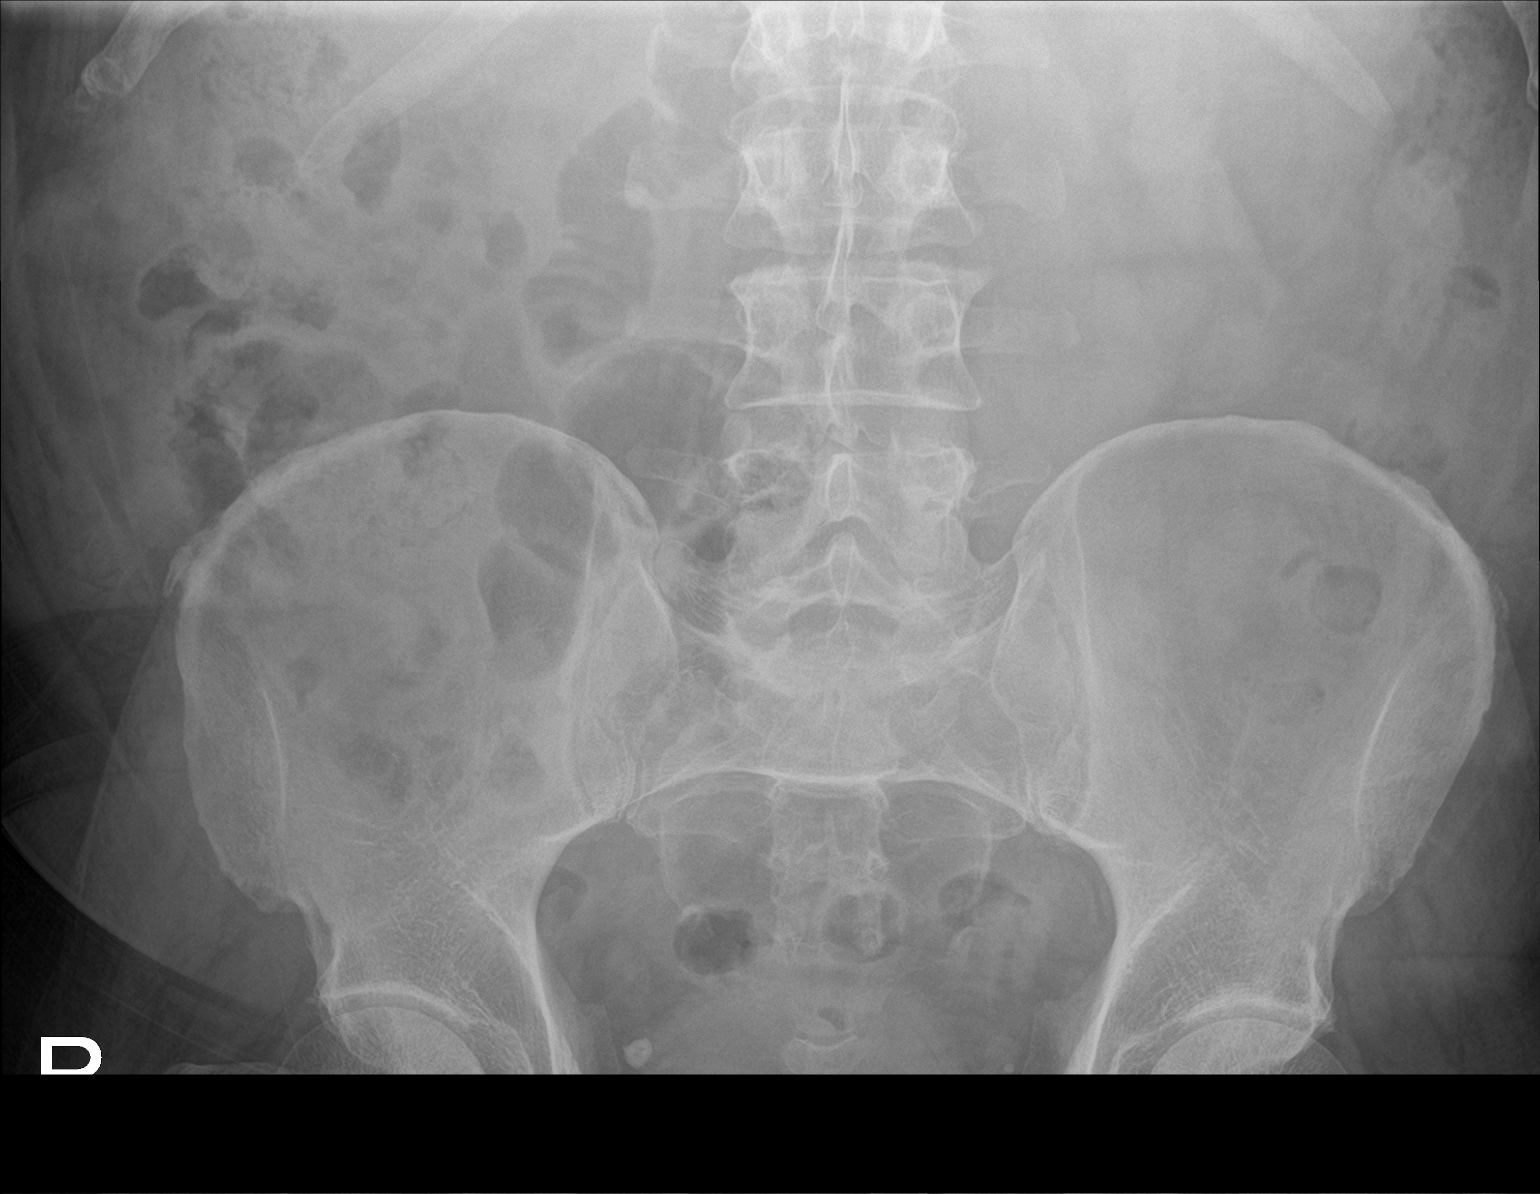

[2 of 2 positions shown; findings below may reference images not displayed]

FINDINGS: Previously seen left ureteral calculus on CT is not visualized by
x-ray. Unchanged phleboliths in the pelvis. Unchanged renovascular
calcification near the proximal right twelfth rib. Unchanged
gallstones in the right upper quadrant. Normal bowel gas pattern. No
acute osseous abnormality.
IMPRESSION: 1. Previously seen left ureteral calculus on CT is not visualized by
x-ray.
2. Unchanged cholelithiasis.

## 2023-09-15 ENCOUNTER — Other Ambulatory Visit: Payer: Self-pay | Admitting: *Deleted

## 2023-09-15 ENCOUNTER — Encounter: Payer: Self-pay | Admitting: Physician Assistant

## 2023-09-15 DIAGNOSIS — E119 Type 2 diabetes mellitus without complications: Secondary | ICD-10-CM

## 2023-09-15 MED ORDER — OZEMPIC (1 MG/DOSE) 4 MG/3ML ~~LOC~~ SOPN
1.0000 mg | PEN_INJECTOR | SUBCUTANEOUS | 0 refills | Status: DC
Start: 2023-09-15 — End: 2023-10-18

## 2023-09-23 ENCOUNTER — Ambulatory Visit: Attending: Cardiology | Admitting: Cardiology

## 2023-09-23 ENCOUNTER — Encounter: Payer: Self-pay | Admitting: Cardiology

## 2023-09-23 VITALS — BP 130/78 | HR 96 | Ht 72.0 in | Wt 239.4 lb

## 2023-09-23 DIAGNOSIS — I471 Supraventricular tachycardia, unspecified: Secondary | ICD-10-CM

## 2023-09-23 DIAGNOSIS — E1169 Type 2 diabetes mellitus with other specified complication: Secondary | ICD-10-CM

## 2023-09-23 DIAGNOSIS — E8881 Metabolic syndrome: Secondary | ICD-10-CM | POA: Diagnosis not present

## 2023-09-23 DIAGNOSIS — E1159 Type 2 diabetes mellitus with other circulatory complications: Secondary | ICD-10-CM

## 2023-09-23 DIAGNOSIS — E785 Hyperlipidemia, unspecified: Secondary | ICD-10-CM

## 2023-09-23 DIAGNOSIS — Z79899 Other long term (current) drug therapy: Secondary | ICD-10-CM

## 2023-09-23 DIAGNOSIS — I152 Hypertension secondary to endocrine disorders: Secondary | ICD-10-CM | POA: Diagnosis not present

## 2023-09-23 MED ORDER — METOPROLOL SUCCINATE ER 25 MG PO TB24
25.0000 mg | ORAL_TABLET | Freq: Every day | ORAL | 3 refills | Status: AC
Start: 2023-09-23 — End: ?

## 2023-09-23 MED ORDER — METOPROLOL SUCCINATE ER 25 MG PO TB24: 25.0000 mg | ORAL_TABLET | Freq: Every day | ORAL | 0 refills | Status: DC

## 2023-09-23 NOTE — Patient Instructions (Signed)
 Medication Instructions:  Your physician recommends that you continue on your current medications as directed. Please refer to the Current Medication list given to you today.   *If you need a refill on your cardiac medications before your next appointment, please call your pharmacy*  Lab Work: Your provider would like for you to return in Sometime within one month to have the following labs drawn: Lipid Panel, Hepatic Function Panel.   Please go to [facility] [address] ([facility] [address], [place] [address] You do not need an appointment.  They are open from 8 am- 4:30 pm.  Lunch from 1:00 pm- 2:00 pm You DO need to be fasting.   You may also go to one of the following [facility]:  [address] [place], [place] [address] Phone: [phone] Lab hours: Mon-Fri 8 am- 5 pm    Lunch 12 pm- 1 pm  [address] [place],  [place]  [address]  [place]  Phone: [phone] Lab hours: 7 am- 4 pm Lunch 12 pm-1 pm   [address] [place],  [place]  [address]  [place]  Phone: [phone] Lab hours: Mon-Fri 8 am- 5 pm    Lunch 12 pm- 1 pm  If you have labs (blood work) drawn today and your tests are completely normal, you will receive your results only by: MyChart Message (if you have MyChart) OR A paper copy in the mail If you have any lab test that is abnormal or we need to change your treatment, we will call you to review the results.  Testing/Procedures: None ordered at this time   Follow-Up: At [facility], you and your health needs are our priority.  As part of our continuing mission to provide you with exceptional heart care, our providers are all part of one team.  This team includes your primary Cardiologist (physician) and Advanced Practice Providers or APPs (Physician Assistants and Nurse Practitioners) who all work together to provide you with the care you need, when you need it.  Your next appointment:   1 year(s)  Provider:   You may see Dr.  Alm Clay or one of the following Advanced Practice Providers on your designated Care Team:   Lonni Meager, NP Lesley Maffucci, PA-C Bernardino Bring, PA-C Cadence Spring Mount, PA-C Tylene Lunch, NP Barnie Hila, NP    We recommend signing up for the patient portal called MyChart.  Sign up information is provided on this After Visit Summary.  MyChart is used to connect with patients for Virtual Visits (Telemedicine).  Patients are able to view lab/test results, encounter notes, upcoming appointments, etc.  Non-urgent messages can be sent to your provider as well.   To learn more about what you can do with MyChart, go to [email or web address].

## 2023-09-23 NOTE — Progress Notes (Unsigned)
 Cardiology Office Note:  .   Date:  09/24/2023  ID:  Jerome Graves, DOB [birth year], MRN [id number] PCP: Cyndi Shaver, PA-C  [facility] Providers Cardiologist:  None     Chief Complaint  Patient presents with   Follow-up    Delayed annual follow-up.  No complaints   Hypertension    Well-controlled    Patient Profile: .     [name] is an obese 58 y.o. male with a PMH notable for HTN, HLD, DM-2 (metabolic syndrome) who presents here for annual follow-up with history of SVT.   Originally referred at the request of Cyndi Shaver, PA-C.    Jerome Graves was last seen on June 25, 2022.  Noted that his ST episodes are pretty well-controlled.  Often related to hyperglycemia and dehydration.  He is working on maintaining adequate hydration and we discussed regularly risk.  We started Toprol  25 mg daily partially because of hypertension and for palpitations.  He was already on 100 mg of losartan  and 10 mg of amlodipine .  I was reluctant to titrate chlorthalidone  further to avoid dehydration.  LDL was 51 and he an A1c was 6.7, but apparently his weight is gone up before going back down again.  Subjective  Discussed the use of AI scribe software for clinical note transcription with the patient, who gave verbal consent to proceed.  History of Present Illness  Jerome Graves is a 58 year old male with hypertension and type 2 diabetes who presents for a follow-up visit.  He has been stable with no episodes of tachycardia, dizziness, or dyspnea. No chest pain or pressure during physical activity or at rest. He has not experienced any palpitations or syncope.  He started Ozempic  approximately six months ago, which has significantly helped in controlling his blood glucose levels. He has lost about 25 pounds since starting the medication, with his weight fluctuating between 229 and 247 pounds. His last recorded weight was 243 pounds. He has not needed to take any extra  doses of metoprolol .  His current medications include Ozempic , metformin  1000 mg twice daily, metoprolol  25 mg, chlorthalidone  25 mg, losartan  100 mg, and amlodipine  10 mg.  No hematochezia, peripheral edema, or need for additional doses of metoprolol . He has not experienced any symptoms such as palpitations or fast heart rate spells.  His last A1c was 6.1. His cholesterol levels were not checked during his last lab work, but he recalls his cholesterol, HDL, and LDL levels were good, with triglycerides slightly elevated in the past.  Cardiovascular ROS: no chest pain or dyspnea on exertion negative for - edema, irregular heartbeat, orthopnea, palpitations, paroxysmal nocturnal dyspnea, rapid heart rate, shortness of breath, or lightheadedness, dizziness or wooziness, syncope/near syncope or TIA/CVA/amaurosis fugax, claudication  ROS:  Review of Systems - Negative except symptoms noted above    Objective   Medications - Metoprolol  XL (Toprol ) 25 mg daily; - Chlorthalidone  25 mg; - Losartan  100 mg daily; - Amlodipine  10 mg daily - Metformin  1000 mg (500 mg-2 tabs) twice a day; - Ozempic  1 mg injections - Atorvastatin  20 mg daily  Studies Reviewed: SABRA   EKG Interpretation Date/Time:  Thursday September 23 2023 08:04:02 EDT Ventricular Rate:  96 PR Interval:  182 QRS Duration:  96 QT Interval:  362 QTC Calculation: 457 R Axis:   -29  Text Interpretation: Normal sinus rhythm Normal ECG When compared with ECG of 16-May-2021 17:26, Rate slower axis has shifted Confirmed by Anner Lenis ([id number])  on 09/23/2023 8:14:08 AM    Lab Results  Component Value Date   CHOL 139 08/20/2022   HDL 50 08/20/2022   LDLCALC 56 08/20/2022   TRIG 204 (H) 08/20/2022   CHOLHDL 2.8 08/20/2022   Lab Results  Component Value Date   NA 138 08/04/2023   K 3.7 08/04/2023   CREATININE 1.41 08/04/2023   GFR 55.34 (L) 08/04/2023   GLUCOSE 103 (H) 08/04/2023   Lab Results  Component Value Date   HGBA1C 6.1  08/04/2023    Risk Assessment/Calculations:          Physical Exam:   VS:  BP 130/78 (BP Location: Left Arm, Patient Position: Sitting, Cuff Size: Normal)   Pulse 96   Ht 6' (1.829 m)   Wt 239 lb 6.4 oz (108.6 kg)   SpO2 98%   BMI 32.47 kg/m    Wt Readings from Last 3 Encounters:  09/23/23 239 lb 6.4 oz (108.6 kg)  08/04/23 243 lb (110.2 kg)  01/04/23 249 lb 6 oz (113.1 kg)    GEN: Well nourished, well groomed in no acute distress; obese NECK: No JVD; No carotid bruits CARDIAC: Distant heart sounds-but normal S1 and S2; RRR, no murmurs, rubs, gallops RESPIRATORY:  Clear to auscultation without rales, wheezing or rhonchi ; nonlabored, good air movement. ABDOMEN: Soft, non-tender, non-distended EXTREMITIES:  No edema; No deformity      ASSESSMENT AND PLAN: .    Problem List Items Addressed This Visit       Cardiology Problems   Hyperlipidemia associated with type 2 diabetes mellitus (HCC) (Chronic)   Hyperlipidemia with slightly elevated triglycerides Weight loss and improved blood sugar may improve lipid profile. - Order fasting cholesterol panel within a month. - Advise fasting after midnight before test, maintain mindful diet.  Type 2 diabetes well-controlled. Hemoglobin A1c 6.1%. Ozempic  effective for blood sugar and weight loss. - Continue metformin  1000 mg twice daily, Ozempic  1 mg injections. - Encourage weight loss, monitor blood glucose.      Relevant Medications   metoprolol  succinate (TOPROL -XL) 25 MG 24 hr tablet   Hypertension associated with diabetes (HCC) (Chronic)   Hypertension well-controlled with current regimen. Blood pressure stable. - Continue chlorthalidone  25 mg, losartan  100 mg, amlodipine  10 mg, metoprolol  XL 25 mg. - Extra metoprolol  dose if tachycardia occurs.      Relevant Medications   metoprolol  succinate (TOPROL -XL) 25 MG 24 hr tablet   Other Relevant Orders   EKG 12-Lead (Completed)   SVT (supraventricular tachycardia) (HCC)  (Chronic)   Doing well with no recurrent symptoms current dose of Toprol -XL 25 mg daily.   - Continue current dose of Toprol  25 mg daily  - Discussed vagal maneuvers and avoiding triggers. -Okay to take an additional dose PRN for tachycardia.      Relevant Medications   metoprolol  succinate (TOPROL -XL) 25 MG 24 hr tablet     Other   Metabolic syndrome - Primary (Chronic)   Obesity, hypertension, hypertriglyceridemia/hyperlipidemia and DM-2 => see individual fields.      Other Visit Diagnoses       Medication management       Relevant Orders   Lipid panel   Hepatic function panel             Follow-Up: Return in about 1 year (around 09/22/2024) for Follow-up with APP or, Routine follow up with me, [place] office.    Signed, Alm MICAEL Clay, MD, MS Alm Clay, M.D., M.S. Interventional Cardiologist  [facility]  Pager # [phone]

## 2023-09-24 ENCOUNTER — Encounter: Payer: Self-pay | Admitting: Cardiology

## 2023-09-24 NOTE — Assessment & Plan Note (Signed)
 Hyperlipidemia with slightly elevated triglycerides Weight loss and improved blood sugar may improve lipid profile. - Order fasting cholesterol panel within a month. - Advise fasting after midnight before test, maintain mindful diet.  Type 2 diabetes well-controlled. Hemoglobin A1c 6.1%. Ozempic  effective for blood sugar and weight loss. - Continue metformin  1000 mg twice daily, Ozempic  1 mg injections. - Encourage weight loss, monitor blood glucose.

## 2023-09-24 NOTE — Assessment & Plan Note (Signed)
 Obesity, hypertension, hypertriglyceridemia/hyperlipidemia and DM-2 => see individual fields.

## 2023-09-24 NOTE — Assessment & Plan Note (Signed)
 Hypertension well-controlled with current regimen. Blood pressure stable. - Continue chlorthalidone  25 mg, losartan  100 mg, amlodipine  10 mg, metoprolol  XL 25 mg. - Extra metoprolol  dose if tachycardia occurs.

## 2023-09-24 NOTE — Assessment & Plan Note (Signed)
 Doing well with no recurrent symptoms current dose of Toprol -XL 25 mg daily.   - Continue current dose of Toprol  25 mg daily  - Discussed vagal maneuvers and avoiding triggers. -Okay to take an additional dose PRN for tachycardia.

## 2023-10-18 ENCOUNTER — Other Ambulatory Visit: Payer: Self-pay | Admitting: Physician Assistant

## 2023-10-18 ENCOUNTER — Encounter: Payer: Self-pay | Admitting: Physician Assistant

## 2023-10-18 DIAGNOSIS — E119 Type 2 diabetes mellitus without complications: Secondary | ICD-10-CM

## 2023-10-25 ENCOUNTER — Other Ambulatory Visit: Payer: Self-pay | Admitting: Physician Assistant

## 2023-10-25 DIAGNOSIS — I152 Hypertension secondary to endocrine disorders: Secondary | ICD-10-CM

## 2023-12-10 ENCOUNTER — Telehealth: Payer: Self-pay | Admitting: Family Medicine

## 2023-12-10 DIAGNOSIS — E119 Type 2 diabetes mellitus without complications: Secondary | ICD-10-CM

## 2023-12-10 NOTE — Telephone Encounter (Signed)
 Copied from CRM [id number]. Topic: Clinical - Medication Refill >> Dec 10, 2023  4:19 PM Alfonso ORN wrote: Medication:  OZEMPIC , 1 MG/DOSE, 4 MG/3ML SOPN   Has the patient contacted their pharmacy? Yes   This is the patient's preferred pharmacy:  [facility] [place], [place] - [address] [place] [address] Phone: [phone] Fax: [phone]  Is this the correct pharmacy for this prescription? Yes   Has the prescription been filled recently? No  Is the patient out of the medication? Yes  Has the patient been seen for an appointment in the last year OR does the patient have an upcoming appointment? Yes  Can we respond through MyChart? Yes  Patient transfer of care appt scheduled with NP Waddell Mon

## 2023-12-10 NOTE — Telephone Encounter (Signed)
 FYI  Copied from CRM [id number]. Topic: Appointments - Transfer of Care >> Dec 10, 2023  4:22 PM Alfonso ORN wrote: Pt is requesting to transfer FROM: Cyndi Shaver Pt is requesting to transfer TO: Almarie Birmingham  Reason for requested transfer: pcp no longer at clinic It is the responsibility of the team the patient would like to transfer to Basilio Almarie) to reach out to the patient if for any reason this transfer is not acceptable.

## 2023-12-13 MED ORDER — OZEMPIC (1 MG/DOSE) 4 MG/3ML ~~LOC~~ SOPN: 1.0000 mg | PEN_INJECTOR | SUBCUTANEOUS | 2 refills | Status: AC

## 2023-12-13 NOTE — Telephone Encounter (Signed)
 Refills sent

## 2023-12-13 NOTE — Addendum Note (Signed)
 Addended by: Talton Delpriore L on: 12/13/2023 08:16 AM   Modules accepted: Orders

## 2023-12-13 NOTE — Telephone Encounter (Signed)
 Ok for transfer. Ok to provide refills to last him until our appointment.

## 2023-12-28 ENCOUNTER — Telehealth: Payer: Self-pay | Admitting: Physician Assistant

## 2023-12-31 ENCOUNTER — Telehealth: Payer: Self-pay | Admitting: Family Medicine

## 2023-12-31 DIAGNOSIS — E1165 Type 2 diabetes mellitus with hyperglycemia: Secondary | ICD-10-CM

## 2023-12-31 MED ORDER — METFORMIN HCL 500 MG PO TABS: 1000.0000 mg | ORAL_TABLET | Freq: Two times a day (BID) | ORAL | 1 refills | Status: DC

## 2023-12-31 NOTE — Telephone Encounter (Signed)
 Metformin  refill provided. Needs to keep upcoming transfer of care appt.

## 2023-12-31 NOTE — Telephone Encounter (Unsigned)
 Copied from CRM #[id number]. Topic: Clinical - Medication Refill >> Dec 31, 2023 12:33 PM Alfonso HERO wrote: Medication:  metFORMIN  (GLUCOPHAGE ) 500 MG tablet   Has the patient contacted their pharmacy? Yes (Agent: If no, request that the patient contact the pharmacy for the refill. If patient does not wish to contact the pharmacy document the reason why and proceed with request.) (Agent: If yes, when and what did the pharmacy advise?)  This is the patient's preferred pharmacy:  [facility] - [place], [place] - [address] [place] [address] Phone: [phone] Fax: [phone]  Is this the correct pharmacy for this prescription? Yes If no, delete pharmacy and type the correct one.   Has the prescription been filled recently? Yes  Is the patient out of the medication? Yes  Has the patient been seen for an appointment in the last year OR does the patient have an upcoming appointment? Yes  Can we respond through MyChart? Yes  Agent: Please be advised that Rx refills may take up to 3 business days. We ask that you follow-up with your pharmacy.

## 2024-02-10 ENCOUNTER — Encounter: Payer: Self-pay | Admitting: Family Medicine

## 2024-02-10 DIAGNOSIS — E1169 Type 2 diabetes mellitus with other specified complication: Secondary | ICD-10-CM

## 2024-02-11 ENCOUNTER — Other Ambulatory Visit: Payer: Self-pay | Admitting: Family

## 2024-02-11 DIAGNOSIS — E1169 Type 2 diabetes mellitus with other specified complication: Secondary | ICD-10-CM

## 2024-02-11 MED ORDER — ATORVASTATIN CALCIUM 20 MG PO TABS: 20.0000 mg | ORAL_TABLET | Freq: Every day | ORAL | 3 refills | Status: AC

## 2024-03-13 ENCOUNTER — Encounter: Admitting: Family Medicine

## 2024-03-14 ENCOUNTER — Encounter: Admitting: Family Medicine

## 2024-03-16 ENCOUNTER — Encounter: Payer: Self-pay | Admitting: Family Medicine

## 2024-03-16 ENCOUNTER — Ambulatory Visit: Admitting: Family Medicine

## 2024-03-16 VITALS — BP 141/89 | HR 99 | Resp 16 | Ht 72.0 in | Wt 244.0 lb

## 2024-03-16 DIAGNOSIS — Z7689 Persons encountering health services in other specified circumstances: Secondary | ICD-10-CM | POA: Diagnosis not present

## 2024-03-16 DIAGNOSIS — Z7984 Long term (current) use of oral hypoglycemic drugs: Secondary | ICD-10-CM

## 2024-03-16 DIAGNOSIS — E1169 Type 2 diabetes mellitus with other specified complication: Secondary | ICD-10-CM | POA: Diagnosis not present

## 2024-03-16 DIAGNOSIS — I152 Hypertension secondary to endocrine disorders: Secondary | ICD-10-CM | POA: Diagnosis not present

## 2024-03-16 DIAGNOSIS — E1159 Type 2 diabetes mellitus with other circulatory complications: Secondary | ICD-10-CM | POA: Diagnosis not present

## 2024-03-16 DIAGNOSIS — E785 Hyperlipidemia, unspecified: Secondary | ICD-10-CM

## 2024-03-16 DIAGNOSIS — E1165 Type 2 diabetes mellitus with hyperglycemia: Secondary | ICD-10-CM | POA: Diagnosis not present

## 2024-03-16 DIAGNOSIS — N200 Calculus of kidney: Secondary | ICD-10-CM | POA: Diagnosis not present

## 2024-03-16 MED ORDER — CHLORTHALIDONE 25 MG PO TABS: 25.0000 mg | ORAL_TABLET | Freq: Every day | ORAL | 3 refills | Status: AC

## 2024-03-16 MED ORDER — TAMSULOSIN HCL 0.4 MG PO CAPS: ORAL_CAPSULE | ORAL | 3 refills | Status: AC

## 2024-03-16 NOTE — Progress Notes (Signed)
 "  New Patient Office Visit  Subjective   Patient ID: Jerome Graves, male    DOB: 03-22-1965  Age: 59 y.o. MRN: [id number]  CC:  Chief Complaint  Patient presents with   Transitions Of Care   Discussed the use of AI scribe software for clinical note transcription with the patient, who gave verbal consent to proceed.  History of Present Illness   Jerome Graves is a 59 year old male who presents to establish with [facility] at [facility].  He has a history with recurrent kidney stones who presents with a current episode of nephrolithiasis, HTN, HLD, DM2, and SVT.  He has been attempting to pass a kidney stone for the past two days, experiencing hematuria but no current pain. The pain was significant yesterday but has become tolerable as the stone has moved to a less painful area. He experiences kidney stones frequently, sometimes every couple of months, but occasionally goes a year or two without one. He last consulted a nephrologist and urologist a couple of years ago and only seeks his care when unable to pass a stone. He has been increasing his fluid intake, including water  and lemonade, to aid in passing the stone. No other urinary symptoms besides the hematuria two days ago, which has since resolved.   He has a history of hypertension and is currently on losartan  100mg  daily, metoprolol  25 mg daily, chlorthalidone  25mg  daily, and amlodipine  10mg  daily. His blood pressure is usually around 130/80 mmHg at home, though it tends to be higher during medical visits. No chest pain, shortness of breath, vision changes, headache, speech difficulty, or peripheral edema. He has seen cardiology in the past.   He has a history of diabetes and is on Ozempic  1 mg weekly and metformin  1000mg  BID for diabetes, which he tolerates well with occasional mild nausea. He has a history of hyperlipidemia- currently on atorvastatin  20mg  daily.   Outpatient Encounter Medications as of 03/16/2024   Medication Sig   amLODipine  (NORVASC ) 10 MG tablet TAKE 1 TABLET BY MOUTH ONCE DAILY   atorvastatin  (LIPITOR) 20 MG tablet Take 1 tablet (20 mg total) by mouth daily.   azelastine  (ASTELIN ) 0.1 % nasal spray Place 1 spray into both nostrils 2 (two) times daily. Use in each nostril as directed   blood glucose meter kit and supplies KIT Dispense based on patient and insurance preference. Use up to four times daily as directed.   glucose blood ([employer] GLUCOSE METER TEST STRIPS) test strip Use as instructed   losartan  (COZAAR ) 100 MG tablet Take 1 tablet (100 mg total) by mouth daily.   metFORMIN  (GLUCOPHAGE ) 500 MG tablet Take 2 tablets (1,000 mg total) by mouth 2 (two) times daily with a meal.   metoprolol  succinate (TOPROL -XL) 25 MG 24 hr tablet Take 1 tablet (25 mg total) by mouth daily.   Semaglutide , 1 MG/DOSE, (OZEMPIC , 1 MG/DOSE,) 4 MG/3ML SOPN Inject 1 mg into the skin once a week.   tamsulosin  (FLOMAX ) 0.4 MG CAPS capsule Take 1 tablet in the morning for 14 days.   [DISCONTINUED] chlorthalidone  (HYGROTON ) 25 MG tablet Take 1 tablet (25 mg total) by mouth daily.   chlorthalidone  (HYGROTON ) 25 MG tablet Take 1 tablet (25 mg total) by mouth daily.   No facility-administered encounter medications on file as of 03/16/2024.    Patient Active Problem List   Diagnosis Date Noted   Type 2 diabetes mellitus with hyperglycemia, without long-term current use of insulin  (HCC)  03/16/2024   CKD (chronic kidney disease) stage 3, GFR 30-59 ml/min (HCC) 01/05/2023   Metabolic syndrome 06/26/2022   Hyperlipidemia associated with type 2 diabetes mellitus (HCC) 05/16/2021   SVT (supraventricular tachycardia) 05/16/2021   Hypertension associated with diabetes (HCC) 07/15/2015   Acid reflux 09/21/2014   Past Medical History:  Diagnosis Date   Diabetes mellitus without complication (HCC) 05/16/2021   GERD (gastroesophageal reflux disease)    Gross hematuria    H/O renal calculi 09/21/2014   History of  chicken pox 09/21/2014   DID have Chicken Pox.      History of kidney stones    Hyperlipidemia 05/16/2021   Hypertension    Renal calculus, right    Right flank pain    Past Surgical History:  Procedure Laterality Date   CYSTOSCOPY W/ RETROGRADES Left 05/01/2021   Procedure: CYSTOSCOPY WITH RETROGRADE PYELOGRAM;  Surgeon: Francisca Redell BROCKS, MD;  Location: [facility];  Service: Urology;  Laterality: Left;   CYSTOSCOPY WITH RETROGRADE PYELOGRAM, URETEROSCOPY AND STENT PLACEMENT Bilateral 09/13/2013   Procedure: CYSTOSCOPY WITH BILATERAL RETROGRADE PYELOGRAM, RIGHT URETEROSCOPY AND STENT PLACEMENT;  Surgeon: Ricardo Likens, MD;  Location: [facility];  Service: Urology;  Laterality: Bilateral;   CYSTOSCOPY/URETEROSCOPY/HOLMIUM LASER/STENT PLACEMENT Left 05/01/2021   Procedure: CYSTOSCOPY/URETEROSCOPY/HOLMIUM LASER/STENT PLACEMENT;  Surgeon: Francisca Redell BROCKS, MD;  Location: [facility];  Service: Urology;  Laterality: Left;   EXTRACORPOREAL SHOCK WAVE LITHOTRIPSY Left 05/01/2021   Procedure: EXTRACORPOREAL SHOCK WAVE LITHOTRIPSY (ESWL);  Surgeon: Francisca Redell BROCKS, MD;  Location: [facility];  Service: Urology;  Laterality: Left;   HOLMIUM LASER APPLICATION Right 09/13/2013   Procedure: HOLMIUM LASER APPLICATION;  Surgeon: Ricardo Likens, MD;  Location: [facility];  Service: Urology;  Laterality: Right;   NEGATIVE SLEEP STUDY     2003 -- PER PT   PERCUTANEOUS NEPHROLITHOTRIPSY Left 2010   PINNING RIGHT RING FINGER FX  YRS AGO   Family History  Problem Relation Age of Onset   Heart disease Mother    Cancer Father    Social History   Socioeconomic History   Marital status: Married    Spouse name: Not on file   Number of children: Not on file   Years of education: Not on file   Highest education level: 12th grade  Occupational History   Not on file  Tobacco Use   Smoking status: Never   Smokeless tobacco: Never  Vaping Use   Vaping status: Never Used  Substance and  Sexual Activity   Alcohol use: Yes    Alcohol/week: 2.0 standard drinks of alcohol    Types: 2 Standard drinks or equivalent per week   Drug use: No   Sexual activity: Not on file  Other Topics Concern   Not on file  Social History Narrative   Not on file   Social Drivers of Health   Tobacco Use: Low Risk (03/16/2024)   Patient History    Smoking Tobacco Use: Never    Smokeless Tobacco Use: Never    Passive Exposure: Not on file  Financial Resource Strain: Low Risk (03/14/2024)   Overall Financial Resource Strain (CARDIA)    Difficulty of Paying Living Expenses: Not hard at all  Food Insecurity: No Food Insecurity (03/14/2024)   Epic    Worried About Radiation Protection Practitioner of Food in the Last Year: Never true    Ran Out of Food in the Last Year: Never true  Transportation Needs: No Transportation Needs (03/14/2024)   Epic  Lack of Transportation (Medical): No    Lack of Transportation (Non-Medical): No  Physical Activity: Inactive (03/14/2024)   Exercise Vital Sign    Days of Exercise per Week: 0 days    Minutes of Exercise per Session: Not on file  Stress: Stress Concern Present (03/14/2024)   [employer] of Occupational Health - Occupational Stress Questionnaire    Feeling of Stress: Rather much  Social Connections: Moderately Isolated (03/14/2024)   Social Connection and Isolation Panel    Frequency of Communication with Friends and Family: More than three times a week    Frequency of Social Gatherings with Friends and Family: Once a week    Attends Religious Services: Never    Database Administrator or Organizations: No    Attends Engineer, Structural: Not on file    Marital Status: Married  Catering Manager Violence: Not on file  Depression (PHQ2-9): Low Risk (03/16/2024)   Depression (PHQ2-9)    PHQ-2 Score: 1  Alcohol Screen: Medium Risk (03/14/2024)   Alcohol Screen    Last Alcohol Screening Score (AUDIT): 8  Housing: Low Risk (03/14/2024)   Epic    Unable to  Pay for Housing in the Last Year: No    Number of Times Moved in the Last Year: 0    Homeless in the Last Year: No  Utilities: Not on file  Health Literacy: Not on file   Outpatient Medications Prior to Visit  Medication Sig Dispense Refill   amLODipine  (NORVASC ) 10 MG tablet TAKE 1 TABLET BY MOUTH ONCE DAILY 90 tablet 3   atorvastatin  (LIPITOR) 20 MG tablet Take 1 tablet (20 mg total) by mouth daily. 90 tablet 3   azelastine  (ASTELIN ) 0.1 % nasal spray Place 1 spray into both nostrils 2 (two) times daily. Use in each nostril as directed 30 mL 0   blood glucose meter kit and supplies KIT Dispense based on patient and insurance preference. Use up to four times daily as directed. 1 each 0   glucose blood ([employer] GLUCOSE METER TEST STRIPS) test strip Use as instructed 300 each 3   losartan  (COZAAR ) 100 MG tablet Take 1 tablet (100 mg total) by mouth daily. 90 tablet 2   metFORMIN  (GLUCOPHAGE ) 500 MG tablet Take 2 tablets (1,000 mg total) by mouth 2 (two) times daily with a meal. 360 tablet 1   metoprolol  succinate (TOPROL -XL) 25 MG 24 hr tablet Take 1 tablet (25 mg total) by mouth daily. 90 tablet 3   Semaglutide , 1 MG/DOSE, (OZEMPIC , 1 MG/DOSE,) 4 MG/3ML SOPN Inject 1 mg into the skin once a week. 3 mL 2   chlorthalidone  (HYGROTON ) 25 MG tablet Take 1 tablet (25 mg total) by mouth daily. 90 tablet 3   No facility-administered medications prior to visit.   Allergies[1]  ROS: see HPI    Objective   Today's Vitals   03/16/24 0828 03/16/24 0947  BP: (!) 146/90 (!) 141/89  Pulse: 99   Resp: 16   SpO2: 97%   Weight: 244 lb (110.7 kg)   Height: 6' (1.829 m)   PainSc: 0-No pain    GENERAL: Well-appearing, in NAD. Well nourished.  SKIN: Pink, warm and dry. No rash, lesion, ulceration, or ecchymoses.  Head: Normocephalic. NECK: Trachea midline. Full ROM w/o pain or tenderness. No lymphadenopathy.  EARS: Tympanic membranes are intact, translucent without bulging and without drainage.  Appropriate landmarks visualized.  EYES: Conjunctiva clear without exudates. EOMI, PERRL, no drainage present.  NOSE: Septum midline w/o  deformity. Nares patent, mucosa pink and non-inflamed w/o drainage. No sinus tenderness.  THROAT: Uvula midline. Oropharynx clear. Tonsils non-inflamed without exudate. Mucous membranes pink and moist.  RESPIRATORY: Chest wall symmetrical. Respirations even and non-labored. Breath sounds clear to auscultation bilaterally.  CARDIAC: S1, S2 present, regular rate and rhythm without murmur or gallops. Peripheral pulses 2+ bilaterally.  MSK: Muscle tone and strength appropriate for age. Joints w/o tenderness, redness, or swelling.  EXTREMITIES: Without clubbing, cyanosis, or edema.  NEUROLOGIC: No motor or sensory deficits. Steady, even gait. C2-C12 intact.  PSYCH/MENTAL STATUS: Alert, oriented x 3. Cooperative, appropriate mood and affect.    Assessment & Plan:   1. Encounter to establish care (Primary) Patient is a 63- year-old male who presents today to establish care with primary care at [facility]. Reviewed the past medical history, family history, social history, surgical history, medications and allergies today- updates made as indicated. Patient has concerns today about the following:   2. Hypertension associated with diabetes [facility]) Patient presents today with slightly elevated blood pressure, repeat reading about the same. Patient in no acute distress and is well-appearing. Denies chest pain, shortness of breath, lower extremity edema, vision changes, headaches. Cardiovascular exam with heart regular rate and rhythm. Normal heart sounds, no murmurs present. No lower extremity edema present. Lungs clear to auscultation bilaterally. Patient is currently taking losartan  100mg  daily, metoprolol  25 mg daily, chlorthalidone  25mg  daily, and amlodipine  10mg  daily. Refills provided. Advised patient to closely monitor blood pressure, and return to office sooner if blood  pressure begins to increase greater than 130/80. Follow-up in 3 months.  - chlorthalidone  (HYGROTON ) 25 MG tablet; Take 1 tablet (25 mg total) by mouth daily.  Dispense: 90 tablet; Refill: 3 - Basic Metabolic Panel (BMET)  3. Type 2 diabetes mellitus with hyperglycemia, without long-term current use of insulin  (HCC) Recent hemoglobin A1c completed 08/04/2023 with results of 6.1%. Currently taking metformin  1000mg  BID and Ozempic  1mg  weekly. A1c at goal with current medication regimen. Will repeat A1c today to assess current glycemic control. No refills needed.  - Hemoglobin A1c - Lipid panel - Ambulatory referral to Ophthalmology  4. Hyperlipidemia associated with type 2 diabetes mellitus (HCC) Currently taking atorvastatin  20mg  daily. Continue current regimen at this time. Ordered lipid panel to assess current fasting cholesterol levels.   5. Nephrolithiasis Recurrent nephrolithiasis with tolerable pain and resolved hematuria. Prescribed Flomax  1 tablet (0.4mg ) once daily for 7-14 days. Advised increased fluid intake. Instructed to contact if symptoms worsen or if unable to pass the stone. Recommended follow-up with urology if symptoms persist or worsen. - tamsulosin  (FLOMAX ) 0.4 MG CAPS capsule; Take 1 tablet in the morning for 14 days.  Dispense: 30 capsule; Refill: 3   Return in about 3 months (around 06/14/2024) for chronic conditions .   Evalene Arts, FNP     [1] No Known Allergies  "

## 2024-03-16 NOTE — Patient Instructions (Addendum)

## 2024-03-17 LAB — BASIC METABOLIC PANEL WITH GFR
BUN/Creatinine Ratio: 16 (ref 9–20)
BUN: 23 mg/dL (ref 6–24)
CO2: 18 mmol/L — ABNORMAL LOW (ref 20–29)
Calcium: 9.8 mg/dL (ref 8.7–10.2)
Chloride: 106 mmol/L (ref 96–106)
Creatinine, Ser: 1.41 mg/dL — ABNORMAL HIGH (ref 0.76–1.27)
Glucose: 99 mg/dL (ref 70–99)
Potassium: 4.7 mmol/L (ref 3.5–5.2)
Sodium: 140 mmol/L (ref 134–144)
eGFR: 58 mL/min/1.73 — ABNORMAL LOW

## 2024-03-17 LAB — LIPID PANEL
Chol/HDL Ratio: 2.6 ratio (ref 0.0–5.0)
Cholesterol, Total: 114 mg/dL (ref 100–199)
HDL: 44 mg/dL
LDL Chol Calc (NIH): 46 mg/dL (ref 0–99)
Triglycerides: 136 mg/dL (ref 0–149)
VLDL Cholesterol Cal: 24 mg/dL (ref 5–40)

## 2024-03-17 LAB — HEMOGLOBIN A1C
Est. average glucose Bld gHb Est-mCnc: 123 mg/dL
Hgb A1c MFr Bld: 5.9 % — ABNORMAL HIGH (ref 4.8–5.6)

## 2024-03-21 ENCOUNTER — Ambulatory Visit: Payer: Self-pay | Admitting: Family Medicine

## 2024-03-21 DIAGNOSIS — N1831 Chronic kidney disease, stage 3a: Secondary | ICD-10-CM | POA: Insufficient documentation

## 2024-03-25 ENCOUNTER — Other Ambulatory Visit: Payer: Self-pay | Admitting: Family Medicine

## 2024-03-25 DIAGNOSIS — E1165 Type 2 diabetes mellitus with hyperglycemia: Secondary | ICD-10-CM

## 2024-06-14 ENCOUNTER — Ambulatory Visit: Admitting: Family Medicine
